# Patient Record
Sex: Male | Born: 1950 | Race: Black or African American | Hispanic: No | Marital: Married | Smoking: Current some day smoker
Health system: Southern US, Community
[De-identification: ages and names within clinical notes are randomized; demographics above are authoritative.]

## PROBLEM LIST (undated history)

## (undated) DIAGNOSIS — I8290 Acute embolism and thrombosis of unspecified vein: Secondary | ICD-10-CM

## (undated) DIAGNOSIS — K2970 Gastritis, unspecified, without bleeding: Secondary | ICD-10-CM

## (undated) DIAGNOSIS — B9681 Helicobacter pylori [H. pylori] as the cause of diseases classified elsewhere: Secondary | ICD-10-CM

## (undated) DIAGNOSIS — K852 Alcohol induced acute pancreatitis without necrosis or infection: Secondary | ICD-10-CM

## (undated) DIAGNOSIS — I10 Essential (primary) hypertension: Secondary | ICD-10-CM

## (undated) HISTORY — DX: Gastritis, unspecified, without bleeding: K29.70

## (undated) HISTORY — DX: Helicobacter pylori (H. pylori) as the cause of diseases classified elsewhere: B96.81

## (undated) HISTORY — PX: HIP SURGERY: SHX245

---

## 2001-09-20 ENCOUNTER — Emergency Department (HOSPITAL_COMMUNITY): Admission: EM | Admit: 2001-09-20 | Discharge: 2001-09-20 | Payer: Self-pay | Admitting: Internal Medicine

## 2002-06-10 ENCOUNTER — Emergency Department (HOSPITAL_COMMUNITY): Admission: EM | Admit: 2002-06-10 | Discharge: 2002-06-10 | Payer: Self-pay | Admitting: *Deleted

## 2002-06-10 ENCOUNTER — Encounter: Payer: Self-pay | Admitting: *Deleted

## 2003-09-13 ENCOUNTER — Emergency Department (HOSPITAL_COMMUNITY): Admission: EM | Admit: 2003-09-13 | Discharge: 2003-09-13 | Payer: Self-pay | Admitting: Emergency Medicine

## 2006-05-18 ENCOUNTER — Emergency Department (HOSPITAL_COMMUNITY): Admission: EM | Admit: 2006-05-18 | Discharge: 2006-05-18 | Payer: Self-pay | Admitting: Emergency Medicine

## 2006-05-28 ENCOUNTER — Inpatient Hospital Stay (HOSPITAL_COMMUNITY): Admission: EM | Admit: 2006-05-28 | Discharge: 2006-06-01 | Payer: Self-pay | Admitting: Emergency Medicine

## 2008-12-15 ENCOUNTER — Emergency Department (HOSPITAL_COMMUNITY): Admission: EM | Admit: 2008-12-15 | Discharge: 2008-12-15 | Payer: Self-pay | Admitting: Emergency Medicine

## 2008-12-16 ENCOUNTER — Emergency Department (HOSPITAL_COMMUNITY): Admission: EM | Admit: 2008-12-16 | Discharge: 2008-12-16 | Payer: Self-pay | Admitting: Emergency Medicine

## 2009-03-18 ENCOUNTER — Ambulatory Visit (HOSPITAL_COMMUNITY): Admission: RE | Admit: 2009-03-18 | Discharge: 2009-03-18 | Payer: Self-pay | Admitting: Neurosurgery

## 2009-04-15 ENCOUNTER — Encounter: Admission: RE | Admit: 2009-04-15 | Discharge: 2009-04-15 | Payer: Self-pay | Admitting: Neurosurgery

## 2009-04-25 DIAGNOSIS — I8290 Acute embolism and thrombosis of unspecified vein: Secondary | ICD-10-CM

## 2009-04-25 HISTORY — DX: Acute embolism and thrombosis of unspecified vein: I82.90

## 2009-06-26 ENCOUNTER — Emergency Department (HOSPITAL_COMMUNITY): Admission: EM | Admit: 2009-06-26 | Discharge: 2009-06-26 | Payer: Self-pay | Admitting: Emergency Medicine

## 2009-10-11 ENCOUNTER — Emergency Department (HOSPITAL_COMMUNITY): Admission: EM | Admit: 2009-10-11 | Discharge: 2009-10-11 | Payer: Self-pay | Admitting: Emergency Medicine

## 2010-07-11 LAB — POCT I-STAT, CHEM 8
BUN: 10 mg/dL (ref 6–23)
Calcium, Ion: 1.1 mmol/L — ABNORMAL LOW (ref 1.12–1.32)
Chloride: 109 mEq/L (ref 96–112)
Creatinine, Ser: 0.9 mg/dL (ref 0.4–1.5)
Glucose, Bld: 92 mg/dL (ref 70–99)
HCT: 45 % (ref 39.0–52.0)
TCO2: 24 mmol/L (ref 0–100)

## 2010-07-18 LAB — CBC
HCT: 39.6 % (ref 39.0–52.0)
Hemoglobin: 13.9 g/dL (ref 13.0–17.0)
WBC: 6.8 10*3/uL (ref 4.0–10.5)

## 2010-07-18 LAB — DIFFERENTIAL
Basophils Absolute: 0 10*3/uL (ref 0.0–0.1)
Basophils Relative: 1 % (ref 0–1)
Eosinophils Absolute: 0.1 10*3/uL (ref 0.0–0.7)
Eosinophils Relative: 1 % (ref 0–5)
Lymphocytes Relative: 40 % (ref 12–46)
Monocytes Absolute: 0.5 10*3/uL (ref 0.1–1.0)
Monocytes Relative: 8 % (ref 3–12)
Neutro Abs: 3.4 10*3/uL (ref 1.7–7.7)

## 2010-07-18 LAB — POCT CARDIAC MARKERS
CKMB, poc: <1 ng/mL — ABNORMAL LOW (ref 1.0–8.0)
Myoglobin, poc: 53.9 ng/mL (ref 12–200)
Troponin i, poc: <0.05 ng/mL (ref 0.00–0.09)

## 2010-07-18 LAB — BASIC METABOLIC PANEL
Calcium: 8.6 mg/dL (ref 8.4–10.5)
Creatinine, Ser: 0.87 mg/dL (ref 0.4–1.5)
Glucose, Bld: 90 mg/dL (ref 70–99)
Sodium: 139 mEq/L (ref 135–145)

## 2010-09-10 NOTE — H&P (Signed)
[name], [name] NO.:  [id number]   MEDICAL RECORD NO.:  [id number]          PATIENT TYPE:  INP   LOCATION:  A228                          FACILITY:  [facility]   PHYSICIAN:  Gardiner Barefoot, MD    DATE OF BIRTH:  09-Jan-1951   DATE OF ADMISSION:  05/28/2006  DATE OF DISCHARGE:  LH                              HISTORY & PHYSICAL   PRIMARY CARE PHYSICIAN:  Unassigned.   CHIEF COMPLAINT:  Abdominal pain.   HISTORY OF PRESENT ILLNESS:  This is a 60 year old African American with  a history of heavy alcohol use, who comes in with complaint of abdominal  pain for the last 2 days.  The patient does report that this morning he  woke up and it was very painful and of somewhat acute onset.  The  patient reports he has had some nausea and vomiting without any blood,  no fever and pain has been essentially in the lower abdomen on both  sides.  The patient reports he has had this pain before; however, did  not seek medical attention at the time.  Of note, the patient also  reports that he drinks daily and without alcohol, he does go into  withdrawal.  Otherwise, the patient reports no other medical problems.   PAST MEDICAL HISTORY:  None.   MEDICATIONS:  None.   ALLERGIES:  SULFA.   SOCIAL HISTORY:  The patient smokes and drinks daily.  No alcohol use.   FAMILY HISTORY:  Pancreatitis.   REVIEW OF SYSTEMS:  Complete 12-point review of systems was obtained and  was negative other than that as presented in the history of present  illness.   PHYSICAL EXAM:  VITAL SIGNS:  Temperature is 98.0 degrees, pulse 78,  respirations 18, blood pressure 177/91 and O2 SATs 99% on room air.  GENERAL:  The patient is awake, alert and oriented x3 and appears in  mild distress.  HEENT:  Anicteric.  No thrush, no lymphadenopathy.  CARDIOVASCULAR:  Regular rate and rhythm.  No murmurs, rubs, or gallops.  LUNGS:  Clear to auscultation bilaterally.  ABDOMEN:  Soft, diffusely tender and  mildly distended with positive  bowel sounds and no hepatosplenomegaly.  EXTREMITIES:  There is no clubbing, cyanosis, or edema.  SKIN:  No rashes.   LABORATORY DATA:  WBC 6.6, hemoglobin 15.3, platelets 106,000.  Sodium  136, potassium 4.0, chloride 102, bicarb was 22, glucose 106, BUN 6,  creatinine 0.81.  Lipase 338.  Alcohol level 21.  Urinalysis negative.   CAT scan of abdomen and pelvis notable for edema, inflammation in the  retroperitoneal tissues around the pancreatic head and duodenum, likely  associated with pancreatitis.  Doubt mass involvement; however, followup  imaging is recommended for resolution after the pancreatitis has  resolved.  Otherwise, pelvis without any acute findings.   ASSESSMENT AND PLAN:  1. Pancreatitis:  This is likely secondary to his heavy alcohol use      and the patient was counseled on quitting alcohol at this time;      otherwise, he will continue to have issues  with pancreatitis.  The      patient is in agreement and is willing to quit and actually has a      history of being alcohol-free for approximately 7 years, however,      recently started up again due to family stressors.  We will have      the patient be nothing-by-mouth and advance his diet as tolerated      and otherwise, with a normal white count, hemoglobin and other      laboratory values essentially normal, no indication for any further      management for mild to moderate exacerbation of pancreatitis.  2. Pain:  We will assure adequate pain control with Dilaudid and wean      the patient to oral therapy as tolerated.  3. Alcohol abuse:  The patient will be placed on Valium 10 mg t.i.d.      starting today and will wean to b.i.d. the following day and      continue to wean until the patient is off of benzodiazepines.  4. Disposition:  The patient will need referral for a primary      physician.  5. Code status:  The patient is a full code.      Gardiner Barefoot, MD   Electronically Signed     RWC/MEDQ  D:  05/28/2006  T:  05/29/2006  Job:  [id number]

## 2010-09-10 NOTE — Discharge Summary (Signed)
NAMEBUCKY, GRIGG NO.:  [id number]   MEDICAL RECORD NO.:  [id number]          PATIENT TYPE:  INP   LOCATION:  A228                          FACILITY:  [facility]   PHYSICIAN:  Skeet Latch, DO    DATE OF BIRTH:  [birth year]   DATE OF ADMISSION:  05/28/2006  DATE OF DISCHARGE:  02/07/2008LH                               DISCHARGE SUMMARY   DISCHARGE DIAGNOSES:  1. Acute pancreatitis.  2. Alcohol abuse.  3. Hypocalcemia.  4. Hypokalemia.  5. Abdominal pain.   BRIEF HOSPITAL COURSE:  Mr. Mcphee is a 60 year old African-American  male who presented with a long-standing history of heavy alcohol use.  The patient presented complaining of severe abdominal pain that lasted  for approximately 2 days. The patient states the pain was acute in onset  and he had some nausea and vomiting.  The pain was located in his epigastric region and radiated to his lower  abdomen. The patient did report he had this pain in the past but did not  seek medical treatment for it. The patient was admitted to the hospital  and kept n.p.o. The patient did have a CT of his abdomen and pelvis  which showed inflammation of the retroperitoneal tissues around the  pancreatic head and duodenum. Also showed primary duodenitis with  secondary involvement of the pancreas. It also showed some diverticular  change in the sigmoid colon. The patient's diet was slowly increased and  patient started tolerating liquids and solid foods. The patient's pain  was controlled with IV medication. Patient's blood pressure was also  elevated and he was started on oral blood pressure medication. The  patient was also kept on an alcohol withdrawal protocol which included  Ativan. The patient was also placed on thiamin, a multivitamin and folic  acid. The patient's amylase and lipase were elevated and have slowly  decreased. The patient is very anxious to be discharged. The patient's  amylase and lipase today were  again elevated but the patient is adamant  about going home.   CONDITION AT DISCHARGE:  Stable.   ACTIVITY:  Increase activity as tolerated.   DIET:  Heart healthy low sodium diet. The patient is to refrain from any  high fatty foods.   PERTINENT TO NURSING: Will try to find patient a local physician for  follow-up.   The patient is to abstain from any alcohol use of any kind. The patient  was offered alcohol rehabilitation and patient declines at this time. We  had a long discussion regarding risks of continued alcohol use,  regarding liver damage and recurrent pancreatitis. The patient is aware  of risks.   DISCHARGE MEDICATIONS:  1. Norvasc 10 mg daily.  2. Multivitamin daily.  3. Thiamin 100 mg daily.  4. Ativan 1 mg p.o. t.i.d. p.r.n.  5. Reglan 10 mg p.o. q.6-8 hours p.r.n. nausea.  6. Folic acid 1 mg p.o. daily.      Skeet Latch, DO  Electronically Signed     SM/MEDQ  D:  06/01/2006  T:  06/01/2006  Job:  [phone] [id number]

## 2011-02-18 ENCOUNTER — Emergency Department (HOSPITAL_COMMUNITY): Payer: Medicare Other

## 2011-02-18 ENCOUNTER — Encounter: Payer: Self-pay | Admitting: *Deleted

## 2011-02-18 ENCOUNTER — Emergency Department (HOSPITAL_COMMUNITY)
Admission: EM | Admit: 2011-02-18 | Discharge: 2011-02-18 | Disposition: A | Discharge status: Home / Self Care | Payer: Medicare Other | Attending: Internal Medicine | Admitting: Internal Medicine

## 2011-02-18 DIAGNOSIS — K861 Other chronic pancreatitis: Secondary | ICD-10-CM | POA: Insufficient documentation

## 2011-02-18 DIAGNOSIS — F17200 Nicotine dependence, unspecified, uncomplicated: Secondary | ICD-10-CM | POA: Insufficient documentation

## 2011-02-18 DIAGNOSIS — I10 Essential (primary) hypertension: Secondary | ICD-10-CM | POA: Insufficient documentation

## 2011-02-18 DIAGNOSIS — M79609 Pain in unspecified limb: Secondary | ICD-10-CM | POA: Insufficient documentation

## 2011-02-18 DIAGNOSIS — R209 Unspecified disturbances of skin sensation: Secondary | ICD-10-CM | POA: Insufficient documentation

## 2011-02-18 DIAGNOSIS — I82409 Acute embolism and thrombosis of unspecified deep veins of unspecified lower extremity: Secondary | ICD-10-CM | POA: Insufficient documentation

## 2011-02-18 HISTORY — DX: Essential (primary) hypertension: I10

## 2011-02-18 HISTORY — DX: Alcohol induced acute pancreatitis without necrosis or infection: K85.20

## 2011-02-18 LAB — POCT I-STAT, CHEM 8
Calcium, Ion: 1.2 mmol/L (ref 1.12–1.32)
Chloride: 107 mEq/L (ref 96–112)
Potassium: 4.1 mEq/L (ref 3.5–5.1)
Sodium: 143 mEq/L (ref 135–145)

## 2011-02-18 LAB — CBC
HCT: 44.5 % (ref 39.0–52.0)
Hemoglobin: 14.9 g/dL (ref 13.0–17.0)
MCV: 93.3 fL (ref 78.0–100.0)
RBC: 4.77 MIL/uL (ref 4.22–5.81)
RDW: 13.3 % (ref 11.5–15.5)
WBC: 7.1 10*3/uL (ref 4.0–10.5)

## 2011-02-18 MED ORDER — ENOXAPARIN SODIUM 80 MG/0.8ML ~~LOC~~ SOLN
1.0000 mg/kg | Freq: Two times a day (BID) | SUBCUTANEOUS | Status: DC
  Filled 2011-02-18: qty 0.8

## 2011-02-18 MED ORDER — WARFARIN SODIUM 10 MG PO TABS
10.0000 mg | ORAL_TABLET | ORAL | Status: AC
  Administered 2011-02-18: 10 mg via ORAL
  Filled 2011-02-18: qty 1

## 2011-02-18 MED ORDER — ENOXAPARIN SODIUM 100 MG/ML ~~LOC~~ SOLN: 1.0000 mg/kg | Freq: Two times a day (BID) | SUBCUTANEOUS | Status: DC

## 2011-02-18 MED ORDER — ENOXAPARIN (LOVENOX) PATIENT EDUCATION KIT
PACK | Freq: Once | Status: DC
  Filled 2011-02-18: qty 1

## 2011-02-18 MED ORDER — ENOXAPARIN SODIUM 80 MG/0.8ML ~~LOC~~ SOLN
1.0000 mg/kg | Freq: Once | SUBCUTANEOUS | Status: AC
  Administered 2011-02-18: 75 mg via SUBCUTANEOUS
  Filled 2011-02-18: qty 0.8

## 2011-02-18 MED ORDER — WARFARIN SODIUM 7.5 MG PO TABS: 7.5000 mg | ORAL_TABLET | Freq: Every day | ORAL | Status: DC

## 2011-02-18 NOTE — H&P (Signed)
PCP:   No primary provider on file.   Chief Complaint:  Right leg pain since last Monday.   HPI:  Patient is a 60 year old African American male, who has a prior history of alcoholism-claims he has not had a drink for the past 5-6 years, claims to have had no prior history of venous thromboembolism comes in with the above-noted complaints. The patient he started having right calf pain on ambulating 10-20 feet , he said a few days ago his right leg was slightly swollen as well, games that he has numbness in his right toe region. He came to the emergency room for further evaluation today and upon further investigation was found to have a right lower extremity deep vein thrombosis. Patient does not have a primary care practitioner, he has never had to use Lovenox in the past and he is now being admitted to the hospitalist service for further evaluation and treatment. He denies any chest pain or shortness of breath to me. There is no history of fever.  Review of Systems:  The patient denies anorexia, fever, weight loss,, vision loss, decreased hearing, hoarseness, chest pain, syncope, dyspnea on exertion, peripheral edema, balance deficits, hemoptysis, abdominal pain, melena, hematochezia, severe indigestion/heartburn, hematuria, incontinence, genital sores, muscle weakness, suspicious skin lesions, transient blindness, difficulty walking, depression, unusual weight change, abnormal bleeding, enlarged lymph nodes, angioedema, and breast masses.  Past Medical History: Past Medical History  Diagnosis Date  . Pancreatitis, alcoholic   . Hypertension    Past Surgical History  Procedure Date  . Hip surgery     right    Medications: Prior to Admission medications   Medication Sig Start Date End Date Taking? Authorizing Provider  aspirin 325 MG tablet Take 325 mg by mouth daily. For pain    Yes Historical Provider, MD  ibuprofen (ADVIL,MOTRIN) 200 MG tablet Take 200 mg by mouth every 6 (six)  hours as needed. pain    Yes Historical Provider, MD    Allergies:   Allergies  Allergen Reactions  . Sulfa Antibiotics Nausea And Vomiting    Social History:  reports that he has been smoking Cigarettes.  He does not have any smokeless tobacco history on file. He reports that he does not drink alcohol or use illicit drugs.  Family History: No family history on file-however claims that one sister and one brother do have had a history of having venous thromboembolism and are on chronic anticoagulation.  Physical Exam: Blood pressure 136/81, pulse 54, temperature 98.3 F (36.8 C), temperature source Oral, resp. rate 16, height 5\' 6"  (1.676 m), weight 72.576 kg (160 lb), SpO2 98.00%. General appearance -awake alert, speech clear, in no distress at all. HEENT -atraumatic normocephalic pupils are equally reactive to light and accommodation. Neck -supple Chest -bilaterally clear to auscultation CVS -S1-S2 regular, no murmurs heard. Abdomen -bowel sounds present, soft nontender and nondistended. Extremities -no edema, his right leg does not appear swollen or erythematous when compared to his left leg. Neurology -awake alert, nonfocal exam. Speech clear. Skin -no rash Wounds -N./A.  Labs on Admission:   Basename 02/18/11 1112  NA 143  K 4.1  CL 107  CO2 --  GLUCOSE 85  BUN 12  CREATININE 1.00  CALCIUM --  MG --  PHOS --   No results found for this basename: AST:2,ALT:2,ALKPHOS:2,BILITOT:2,PROT:2,ALBUMIN:2 in the last 72 hours No results found for this basename: LIPASE:2,AMYLASE:2 in the last 72 hours  Basename 02/18/11 1112  WBC --  NEUTROABS --  HGB  14.6  HCT 43.0  MCV --  PLT --   No results found for this basename: CKTOTAL:3,CKMB:3,CKMBINDEX:3,TROPONINI:3 in the last 72 hours No results found for this basename: TSH,T4TOTAL,FREET3,T3FREE,THYROIDAB in the last 72 hours No results found for this basename: VITAMINB12:2,FOLATE:2,FERRITIN:2,TIBC:2,IRON:2,RETICCTPCT:2 in  the last 72 hours  Radiological Exams on Admission: Dg Lumbar Spine Complete  02/18/2011  *RADIOLOGY REPORT*  Clinical Data: Right leg and foot tingling.  LUMBAR SPINE - COMPLETE 4+ VIEW  Comparison: CT  05/28/2006  Findings: AP, lateral and oblique images of the lumbar spine were obtained.  There is a component of dextroscoliosis in the lumbar spine.  The vertebral body heights are maintained.  Degenerative changes in the facets. Calcifications involving the abdominal aorta.  IMPRESSION: No acute bony abnormality to the lumbar spine.  Original Report Authenticated By: Richarda Overlie, M.D.   Dg Hip Complete Right  02/18/2011  *RADIOLOGY REPORT*  Clinical Data: Right leg and foot tingling.  RIGHT HIP - COMPLETE 2+ VIEW  Comparison: None.  Findings: Single view of the pelvis and two views of the right hip were obtained.  Deformity of the right proximal femur is suggestive for previous fracture and internal fixation.  Evidence for hardware removal.  There are prominent vascular calcifications in the pelvis and the right hip is located.  No evidence for acute fracture. There may be degenerative change along the inferior aspect of the hip joints bilaterally.  IMPRESSION: No acute bony abnormality to the pelvis or right hip.  Post surgical and/or traumatic changes to the right hip.  Original Report Authenticated By: Richarda Overlie, M.D.   Ct Head Wo Contrast  02/18/2011  *RADIOLOGY REPORT*  Clinical Data: Right-sided leg pain.  Weakness for 5 days.  CT HEAD WITHOUT CONTRAST  Technique:  Contiguous axial images were obtained from the base of the skull through the vertex without contrast.  Comparison: 06/26/2009.  Findings: No mass lesion, mass effect, midline shift, hydrocephalus, hemorrhage.  No territorial ischemia or acute infarction.  Mucoperiosteal thickening is present within the left maxillary sinus.  Sphenoid sinuses clear.  Frontal sinuses clear. Mastoid air cells clear.  IMPRESSION: Negative CT head.   Chronic-appearing left maxillary sinus disease.  Original Report Authenticated By: Andreas Newport, M.D.   US Venous Img Lower Unilateral Right  02/18/2011  *RADIOLOGY REPORT*  Clinical Data: Right leg pain and swelling.   LOWER EXTREMITY VENOUS DUPLEX ULTRASOUND  Technique:  Gray-scale sonography with graded compression, as well as color Doppler and duplex ultrasound were performed to evaluate the deep venous system of the lower extremity from the level of the common femoral vein through the popliteal and proximal calf veins. Spectral Doppler was utilized to evaluate flow at rest and with distal augmentation maneuvers.  Comparison:  None.  Findings:  There is partial compressibility of the right common femoral vein.  There is color Doppler flow within the common femoral vein.  There is flow at the saphenofemoral junction.  The proximal femoral vein is compressible.  There is echogenic material within the right femoral vein suggesting thrombus.  The mid and distal femoral vein are partially compressible with some flow. Partial compressibility of the popliteal vein with eccentric wall thickening or eccentric thrombus. There is color Doppler flow within the calf veins.  IMPRESSION: Study is positive for deep vein thrombosis involving the right femoral vein, right popliteal vein and possibly the right common femoral vein.  However, there is partial compressibility of these vessels with some flow.  Findings most likely represent subacute or  chronic DVT.  Please note that an acute component cannot be excluded.  Original Report Authenticated By: Richarda Overlie, M.D.   Dg Knee Complete 4 Views Right  02/18/2011  *RADIOLOGY REPORT*  Clinical Data: Right leg and foot tingling.  RIGHT KNEE - COMPLETE 4+ VIEW  Comparison: None.  Findings: Four views of the right knee were obtained.  No evidence for acute fracture or dislocation.  No significant degenerative disease.  No evidence to suggest a suprapatellar joint effusion.  There are a few vascular calcifications.  IMPRESSION: No acute bony abnormality to the right knee.  Original Report Authenticated By: Richarda Overlie, M.D.   Dg Foot Complete Right  02/18/2011  *RADIOLOGY REPORT*  Clinical Data: 60 year old male with right foot pain.  RIGHT FOOT COMPLETE - 3+ VIEW  Comparison: None  Findings: There is no evidence of acute fracture, subluxation, or dislocation. The Lisfranc joints are intact. No focal bony lesions are identified. There is no evidence of radiopaque foreign body.  The joint spaces are unremarkable.  IMPRESSION: No acute or significant bony abnormalities.  Original Report Authenticated By: Rosendo Gros, M.D.    Assessment/Plan Present on Admission:  .DVT of leg (deep venous thrombosis) -Given his family history of venous thromboembolism, and given the fact that this patient has had no obvious provoking factors it is very possible that he in fact does have a underlying hypercoagulable state. I will see if he has already received his Lovenox , if not I will try and send out a hypercoagulable panel. If he has also received his Lovenox then he needs to be medically treated for this for at least 3-6 months and then at that point perhaps a hypercoagulable workup will need to be sent out. -Unfortunately this patient does not have a primary care practitioner, I have talked to the patient and his family members and they all are currently in the process of trying to see if they can hook him up with Dr. Lissa Morales, if in fact they are successful in getting an appointment early next week, patient will be provided Lovenox teaching and will be discharged home later today. -I will followup later today.  History of alcoholism-patient claims that he has not had alcohol since his last episode of pancreatitis which he claims was a few years ago.  Tobacco abuse-patient has been counseled extensively.  Total time spent 45 minutes.   Jeoffrey Massed 02/18/2011, 2:23 PM

## 2011-02-18 NOTE — ED Provider Notes (Signed)
Scribed for Laray Anger, DO, the patient was seen in room APA01/APA01 . This chart was scribed by Ellie Lunch.    CSN: [id number] Arrival date & time: 02/18/2011 10:37 AM    Chief Complaint  Patient presents with  . Leg Pain     [name] seen at 10:52 Patient is a 60 y.o. male presenting with leg pain. The history is provided by the patient. No language interpreter was used.  Leg Pain    [name] is a 60 y.o. male who presents to the Emergency Department complaining of gradual onset and persistence of constant "numbness" in his right dorsal foot and "soreness" in his right calf x 4 days. [name] says he was morning lawn 4 days ago when his right lower leg became "swollen." Swelling has improved, but [name] reports the top of his right foot continues to be "numb and tingling."  [name] reports previous right hip surgery where he had pins placed and later removed from his hip "a while ago."  Denies any injury, no rash, no focal motor weakness, no fevers, no abd pain, no back pain.    Past Medical History  Diagnosis Date  . Pancreatitis, alcoholic   . Hypertension     Past Surgical History  Procedure Date  . Hip surgery     right     History  Substance Use Topics  . Smoking status: Current Everyday Smoker    Types: Cigarettes  . Smokeless tobacco: Not on file  . Alcohol Use: No      Review of Systems ROS: Statement: All systems negative except as marked or noted in the HPI; Constitutional: Negative for fever and chills. ; ; Eyes: Negative for eye pain, redness and discharge. ; ; ENMT: Negative for ear pain, hoarseness, nasal congestion, sinus pressure and sore throat. ; ; Cardiovascular: Negative for chest pain, palpitations, diaphoresis, dyspnea and peripheral edema. ; ; Respiratory: Negative for cough, wheezing and stridor. ; ; Gastrointestinal: Negative for nausea, vomiting, diarrhea and abdominal pain, blood in stool, hematemesis, jaundice and rectal bleeding. . ; ;  Genitourinary: Negative for dysuria, flank pain and hematuria. ; ; Musculoskeletal: +right calf pain and swelling.  Negative for back pain and neck pain. Negative for trauma.; ; Skin: Negative for pruritus, rash, abrasions, blisters, bruising and skin lesion.; ; Neuro: Negative for headache, lightheadedness and neck stiffness. Negative for weakness, altered level of consciousness , altered mental status, extremity weakness, involuntary movement, seizure and syncope. +right dorsal foot and anterior ankle "numbness."    Allergies  Sulfa antibiotics  Home Medications   Current Outpatient Rx  Name Route Sig Dispense Refill  . ASPIRIN 325 MG PO TABS Oral Take 325 mg by mouth daily. For pain     . IBUPROFEN 200 MG PO TABS Oral Take 200 mg by mouth every 6 (six) hours as needed. pain       BP 145/94  Pulse 62  Temp(Src) 98.3 F (36.8 C) (Oral)  Resp 18  Ht 5\' 6"  (1.676 m)  Wt 160 lb (72.576 kg)  BMI 25.82 kg/m2  SpO2 97%  Physical Exam 1055: Physical examination:  Nursing notes reviewed; Vital signs and O2 SAT reviewed;  Constitutional: Well developed, Well nourished, Well hydrated, In no acute distress; Head:  Normocephalic, atraumatic; Eyes: EOMI, PERRL, No scleral icterus; ENMT: Mouth and pharynx normal, Mucous membranes moist; Neck: Supple, Full range of motion, No lymphadenopathy; Cardiovascular: Regular rate and rhythm, No murmur, rub, or gallop; Respiratory: Breath sounds clear &  equal bilaterally, No rales, rhonchi, wheezes, or rub, Normal respiratory effort/excursion; Chest: Nontender, Movement normal; Abdomen: Soft, Nontender, Nondistended, Normal bowel sounds; Spine:  No midline CS, TS, LS tenderness. Genitourinary: No CVA tenderness; Extremities: Pedal pulses palpable and equal bilat, No tenderness, No edema, No calf edema or asymmetry, brisk cap refill in toes. Muscle compartments soft RLE.  Right foot warm/dry, no rash..; Neuro: AA&Ox3, Major CN grossly intact.  No gross focal motor  deficits in extremities.  +right dorsal foot with subjective decreased sensation, no decreased sensation plantar right foot.; Skin: Color normal, Warm, Dry, no rash, no petechiae, no open wounds.    ED Course  Procedures  MDM  MDM Reviewed: vitals and nursing note Interpretation: x-ray, labs, CT scan and ultrasound   Results for orders placed during the hospital encounter of 02/18/11  POCT I-STAT, CHEM 8      Component Value Range   Sodium 143  135 - 145 (mEq/L)   Potassium 4.1  3.5 - 5.1 (mEq/L)   Chloride 107  96 - 112 (mEq/L)   BUN 12  6 - 23 (mg/dL)   Creatinine, Ser 2.13  0.50 - 1.35 (mg/dL)   Glucose, Bld 85  70 - 99 (mg/dL)   Calcium, Ion 0.86  1.12 - 1.32 (mmol/L)   TCO2 24  0 - 100 (mmol/L)   Hemoglobin 14.6  13.0 - 17.0 (g/dL)   HCT 57.8  46.9 - 62.9 (%)   Dg Lumbar Spine Complete  02/18/2011  *RADIOLOGY REPORT*  Clinical Data: Right leg and foot tingling.  LUMBAR SPINE - COMPLETE 4+ VIEW  Comparison: CT  05/28/2006  Findings: AP, lateral and oblique images of the lumbar spine were obtained.  There is a component of dextroscoliosis in the lumbar spine.  The vertebral body heights are maintained.  Degenerative changes in the facets. Calcifications involving the abdominal aorta.  IMPRESSION: No acute bony abnormality to the lumbar spine.  Original Report Authenticated By: Richarda Overlie, M.D.   Dg Hip Complete Right  02/18/2011  *RADIOLOGY REPORT*  Clinical Data: Right leg and foot tingling.  RIGHT HIP - COMPLETE 2+ VIEW  Comparison: None.  Findings: Single view of the pelvis and two views of the right hip were obtained.  Deformity of the right proximal femur is suggestive for previous fracture and internal fixation.  Evidence for hardware removal.  There are prominent vascular calcifications in the pelvis and the right hip is located.  No evidence for acute fracture. There may be degenerative change along the inferior aspect of the hip joints bilaterally.  IMPRESSION: No acute  bony abnormality to the pelvis or right hip.  Post surgical and/or traumatic changes to the right hip.  Original Report Authenticated By: Richarda Overlie, M.D.   Ct Head Wo Contrast  02/18/2011  *RADIOLOGY REPORT*  Clinical Data: Right-sided leg pain.  Weakness for 5 days.  CT HEAD WITHOUT CONTRAST  Technique:  Contiguous axial images were obtained from the base of the skull through the vertex without contrast.  Comparison: 06/26/2009.  Findings: No mass lesion, mass effect, midline shift, hydrocephalus, hemorrhage.  No territorial ischemia or acute infarction.  Mucoperiosteal thickening is present within the left maxillary sinus.  Sphenoid sinuses clear.  Frontal sinuses clear. Mastoid air cells clear.  IMPRESSION: Negative CT head.  Chronic-appearing left maxillary sinus disease.  Original Report Authenticated By: Andreas Newport, M.D.   US Venous Img Lower Unilateral Right  02/18/2011  *RADIOLOGY REPORT*  Clinical Data: Right leg pain and swelling.  LOWER EXTREMITY VENOUS DUPLEX ULTRASOUND  Technique:  Gray-scale sonography with graded compression, as well as color Doppler and duplex ultrasound were performed to evaluate the deep venous system of the lower extremity from the level of the common femoral vein through the popliteal and proximal calf veins. Spectral Doppler was utilized to evaluate flow at rest and with distal augmentation maneuvers.  Comparison:  None.  Findings:  There is partial compressibility of the right common femoral vein.  There is color Doppler flow within the common femoral vein.  There is flow at the saphenofemoral junction.  The proximal femoral vein is compressible.  There is echogenic material within the right femoral vein suggesting thrombus.  The mid and distal femoral vein are partially compressible with some flow. Partial compressibility of the popliteal vein with eccentric wall thickening or eccentric thrombus. There is color Doppler flow within the calf veins.  IMPRESSION:  Study is positive for deep vein thrombosis involving the right femoral vein, right popliteal vein and possibly the right common femoral vein.  However, there is partial compressibility of these vessels with some flow.  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  Please note that an acute component cannot be excluded.  Original Report Authenticated By: Richarda Overlie, M.D.   Dg Knee Complete 4 Views Right  02/18/2011  *RADIOLOGY REPORT*  Clinical Data: Right leg and foot tingling.  RIGHT KNEE - COMPLETE 4+ VIEW  Comparison: None.  Findings: Four views of the right knee were obtained.  No evidence for acute fracture or dislocation.  No significant degenerative disease.  No evidence to suggest a suprapatellar joint effusion. There are a few vascular calcifications.  IMPRESSION: No acute bony abnormality to the right knee.  Original Report Authenticated By: Richarda Overlie, M.D.   Dg Foot Complete Right  02/18/2011  *RADIOLOGY REPORT*  Clinical Data: 60 year old male with right foot pain.  RIGHT FOOT COMPLETE - 3+ VIEW  Comparison: None  Findings: There is no evidence of acute fracture, subluxation, or dislocation. The Lisfranc joints are intact. No focal bony lesions are identified. There is no evidence of radiopaque foreign body.  The joint spaces are unremarkable.  IMPRESSION: No acute or significant bony abnormalities.  Original Report Authenticated By: Rosendo Gros, M.D.   1:25 PM:  [name] with acute RLE DVT proximal to knee.  Has no PMD.  Dx testing d/w [name].  Questions answered.  Verb understanding, agreeable to admit.  T/C to [facility]. Jerral Ralph, case discussed, including:  HPI, pertinent PM/SHx, VS/PE, dx testing, ED course and treatment.  Agreeable to admit.  Requests to write temporary orders, med bed to team AP2.  Req to start lovenox SQ and coumadin PO per pharmacy to dose.     [facility] M  I personally performed the services described in this documentation, which was scribed in my  presence. The recorded information has been reviewed and considered.        Laray Anger, DO 02/18/11 [phone]

## 2011-02-18 NOTE — ED Notes (Signed)
C/o pain/swelling/numbness/tingling to right leg x 5 days.  States right calf has been swollen.

## 2011-02-18 NOTE — ED Notes (Signed)
Pt wanting to be d/c home on Lovonox injections and f/u with PCP on Monday.  Admitting MD notified and gave v/o.  Ordered to call Admitting MD when blood work comes back.  Pt verbalized understanding.  Pharmacy called for Lovonox d/c teaching kit.  Social worker also paged to discuss insurance and Rx fills.

## 2011-02-18 NOTE — Discharge Summary (Signed)
Physician Discharge Summary  Patient ID: GROVE DEFINA MRN: [id number] DOB/AGE: 1951-01-26 60 y.o. Primary Care Physician:No primary provider on file. Admit date: 02/18/2011 Discharge date: 02/18/2011    Discharge Diagnoses:  Principal Problem:  *DVT of leg (deep venous thrombosis)   Current Discharge Medication List    START taking these medications   Details  enoxaparin (LOVENOX) 100 MG/ML SOLN Inject 0.75 mLs (75 mg total) into the skin every 12 (twelve) hours. Qty: 22.4 mL, Refills: 0    warfarin (COUMADIN) 7.5 MG tablet Take 1 tablet (7.5 mg total) by mouth daily. Qty: 10 tablet, Refills: 0      CONTINUE these medications which have NOT CHANGED   Details  ibuprofen (ADVIL,MOTRIN) 200 MG tablet Take 200 mg by mouth every 6 (six) hours as needed. pain       STOP taking these medications     aspirin 325 MG tablet         Discharged Condition: Stable  Consults: None  Significant Diagnostic Studies: Dg Lumbar Spine Complete  02/18/2011  *RADIOLOGY REPORT*  Clinical Data: Right leg and foot tingling.  LUMBAR SPINE - COMPLETE 4+ VIEW  Comparison: CT  05/28/2006  Findings: AP, lateral and oblique images of the lumbar spine were obtained.  There is a component of dextroscoliosis in the lumbar spine.  The vertebral body heights are maintained.  Degenerative changes in the facets. Calcifications involving the abdominal aorta.  IMPRESSION: No acute bony abnormality to the lumbar spine.  Original Report Authenticated By: Richarda Overlie, M.D.   Dg Hip Complete Right  02/18/2011  *RADIOLOGY REPORT*  Clinical Data: Right leg and foot tingling.  RIGHT HIP - COMPLETE 2+ VIEW  Comparison: None.  Findings: Single view of the pelvis and two views of the right hip were obtained.  Deformity of the right proximal femur is suggestive for previous fracture and internal fixation.  Evidence for hardware removal.  There are prominent vascular calcifications in the pelvis and the right hip is  located.  No evidence for acute fracture. There may be degenerative change along the inferior aspect of the hip joints bilaterally.  IMPRESSION: No acute bony abnormality to the pelvis or right hip.  Post surgical and/or traumatic changes to the right hip.  Original Report Authenticated By: Richarda Overlie, M.D.   Ct Head Wo Contrast  02/18/2011  *RADIOLOGY REPORT*  Clinical Data: Right-sided leg pain.  Weakness for 5 days.  CT HEAD WITHOUT CONTRAST  Technique:  Contiguous axial images were obtained from the base of the skull through the vertex without contrast.  Comparison: 06/26/2009.  Findings: No mass lesion, mass effect, midline shift, hydrocephalus, hemorrhage.  No territorial ischemia or acute infarction.  Mucoperiosteal thickening is present within the left maxillary sinus.  Sphenoid sinuses clear.  Frontal sinuses clear. Mastoid air cells clear.  IMPRESSION: Negative CT head.  Chronic-appearing left maxillary sinus disease.  Original Report Authenticated By: Andreas Newport, M.D.   US Venous Img Lower Unilateral Right  02/18/2011  *RADIOLOGY REPORT*  Clinical Data: Right leg pain and swelling.   LOWER EXTREMITY VENOUS DUPLEX ULTRASOUND  Technique:  Gray-scale sonography with graded compression, as well as color Doppler and duplex ultrasound were performed to evaluate the deep venous system of the lower extremity from the level of the common femoral vein through the popliteal and proximal calf veins. Spectral Doppler was utilized to evaluate flow at rest and with distal augmentation maneuvers.  Comparison:  None.  Findings:  There is partial compressibility of the  right common femoral vein.  There is color Doppler flow within the common femoral vein.  There is flow at the saphenofemoral junction.  The proximal femoral vein is compressible.  There is echogenic material within the right femoral vein suggesting thrombus.  The mid and distal femoral vein are partially compressible with some flow. Partial  compressibility of the popliteal vein with eccentric wall thickening or eccentric thrombus. There is color Doppler flow within the calf veins.  IMPRESSION: Study is positive for deep vein thrombosis involving the right femoral vein, right popliteal vein and possibly the right common femoral vein.  However, there is partial compressibility of these vessels with some flow.  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  Please note that an acute component cannot be excluded.  Original Report Authenticated By: Richarda Overlie, M.D.   Dg Knee Complete 4 Views Right  02/18/2011  *RADIOLOGY REPORT*  Clinical Data: Right leg and foot tingling.  RIGHT KNEE - COMPLETE 4+ VIEW  Comparison: None.  Findings: Four views of the right knee were obtained.  No evidence for acute fracture or dislocation.  No significant degenerative disease.  No evidence to suggest a suprapatellar joint effusion. There are a few vascular calcifications.  IMPRESSION: No acute bony abnormality to the right knee.  Original Report Authenticated By: Richarda Overlie, M.D.   Dg Foot Complete Right  02/18/2011  *RADIOLOGY REPORT*  Clinical Data: 60 year old male with right foot pain.  RIGHT FOOT COMPLETE - 3+ VIEW  Comparison: None  Findings: There is no evidence of acute fracture, subluxation, or dislocation. The Lisfranc joints are intact. No focal bony lesions are identified. There is no evidence of radiopaque foreign body.  The joint spaces are unremarkable.  IMPRESSION: No acute or significant bony abnormalities.  Original Report Authenticated By: Rosendo Gros, M.D.    Lab Results: Results for orders placed during the hospital encounter of 02/18/11 (from the past 48 hour(s))  POCT I-STAT, CHEM 8     Status: Normal   Collection Time   02/18/11 11:12 AM      Component Value Range Comment   Sodium 143  135 - 145 (mEq/L)    Potassium 4.1  3.5 - 5.1 (mEq/L)    Chloride 107  96 - 112 (mEq/L)    BUN 12  6 - 23 (mg/dL)    Creatinine, Ser  9.14  0.50 - 1.35 (mg/dL)    Glucose, Bld 85  70 - 99 (mg/dL)    Calcium, Ion 7.82  1.12 - 1.32 (mmol/L)    TCO2 24  0 - 100 (mmol/L)    Hemoglobin 14.6  13.0 - 17.0 (g/dL)    HCT 95.6  21.3 - 08.6 (%)   PROTIME-INR     Status: Normal   Collection Time   02/18/11  2:45 PM      Component Value Range Comment   Prothrombin Time 13.4  11.6 - 15.2 (seconds)    INR 1.00  0.00 - 1.49    CBC     Status: Abnormal   Collection Time   02/18/11  2:45 PM      Component Value Range Comment   WBC 7.1  4.0 - 10.5 (K/uL)    RBC 4.77  4.22 - 5.81 (MIL/uL)    Hemoglobin 14.9  13.0 - 17.0 (g/dL)    HCT 57.8  46.9 - 62.9 (%)    MCV 93.3  78.0 - 100.0 (fL)    MCH 31.2  26.0 - 34.0 (pg)  MCHC 33.5  30.0 - 36.0 (g/dL)    RDW 16.1  09.6 - 04.5 (%)    Platelets 115 (*) 150 - 400 (K/uL)    No results found for this or any previous visit (from the past 240 hour(s)).   Hospital Course:  1. Right lower extremity DVT -Patient presented to the ED with right leg pain and numbness, upon further evaluation it was discovered that the patient did have a DVT in his right lower extremity. The hospitalist service was consulted to admit the patient, as the patient did not have a primary care doctor. Upon evaluation by this M.D. and further discussion with the patient and family, they were very keen to be discharged today. The family then arranged a followup appointment for the patient on October 29 at 2 PM with Dr. Damien Fusi. Patient was given Lovenox teaching and a dose of Lovenox as well as Coumadin here in the ED. I have had a detailed conversation with the patient and the patient's closes relative who is like a son and is currently at bedside, the patient's relative is very familiar with Lovenox and claims that he can inject it for the patientl. Prescription for Lovenox and Coumadin have been written, patient has been instructed to go to [place], where they have stocks of Lovenox for the patient to pick  up. Patient will continue on the 75 mg of Lovenox twice daily and 7.5 mg of Coumadin daily until seen by his PCP next Monday. We will then defer further dosing off his Coumadin and Lovenox to his PCP. -Patient was instructed about the side effects of anticoagulation including life threatening bleeding he claimed understanding and was told to seek immediate medical attention if he were to have any  signs of this. -As noted in my history and physical, the patient's brother patient's brother and a sister have a history of having deep vein thrombosis, a Doppler ultrasound done in the ED was suggestive of subacute to chronic DVT, this patient does not have any obvious provoking factors and as a result there is high suspicion for underlying Hypercoagulable state. Unfortunately patient was already given Lovenox before a hypercoagulable panel was ordered and as a result he would now need further workup  to be done after he completes 3-6 months course of anticoagulation. I do not think this patient is up-to-date in terms of  cancer screening, we will defer this to his new primary care practitioner as well, to be done in the outpatient setting. -Patient was instructed to go to the pharmacy straightaway from the emergency room to pick up his prescriptions and then only to go home. He was also told that if in case he had any difficulties filling his prescriptions that he was to return to the emergency room. -Please note that the patient does not have any chest pain or shortness of breath.  Discharge Exam: Blood pressure 124/72, pulse 48, temperature 97.6 F (36.4 C), temperature source Oral, resp. rate 20, height 5\' 6"  (1.676 m), weight 72.576 kg (160 lb), SpO2 100.00%.  Gen. exam-awake alert and clear speech not in any distress. Neck-supple, no JVD neck Chest-bilaterally clear to auscultation. CVS-heart sounds are regular no murmurs heard. Abdomen-soft nontender nondistended. Extremities-no edema. Please note  that the patient's right lower extremity does not show any swelling erythema when compared to his left lower extremity. Neurology-awake alert with no focal deficits.   Disposition: Home.  -Total time spent-30 minutes.    Follow-up Information  Follow up with NNADI,VICTORIA, MD on 02/21/2011. (patient has a scheduled appt at 2pm on 02/21/11)    Contact information:   [address] [place] [address] [phone]          Signed: Jeoffrey Massed 02/18/2011, 5:25 PM

## 2011-05-21 ENCOUNTER — Encounter (HOSPITAL_COMMUNITY): Payer: Self-pay

## 2011-05-21 ENCOUNTER — Emergency Department (HOSPITAL_COMMUNITY)
Admission: EM | Admit: 2011-05-21 | Discharge: 2011-05-21 | Disposition: A | Discharge status: Home / Self Care | Payer: Medicare Other | Attending: Emergency Medicine | Admitting: Emergency Medicine

## 2011-05-21 ENCOUNTER — Emergency Department (HOSPITAL_COMMUNITY): Payer: Medicare Other

## 2011-05-21 DIAGNOSIS — M25519 Pain in unspecified shoulder: Secondary | ICD-10-CM | POA: Insufficient documentation

## 2011-05-21 DIAGNOSIS — Y92009 Unspecified place in unspecified non-institutional (private) residence as the place of occurrence of the external cause: Secondary | ICD-10-CM | POA: Insufficient documentation

## 2011-05-21 DIAGNOSIS — S0990XA Unspecified injury of head, initial encounter: Secondary | ICD-10-CM | POA: Insufficient documentation

## 2011-05-21 DIAGNOSIS — S40019A Contusion of unspecified shoulder, initial encounter: Secondary | ICD-10-CM | POA: Insufficient documentation

## 2011-05-21 DIAGNOSIS — I10 Essential (primary) hypertension: Secondary | ICD-10-CM | POA: Insufficient documentation

## 2011-05-21 DIAGNOSIS — F17200 Nicotine dependence, unspecified, uncomplicated: Secondary | ICD-10-CM | POA: Insufficient documentation

## 2011-05-21 DIAGNOSIS — Z7901 Long term (current) use of anticoagulants: Secondary | ICD-10-CM | POA: Insufficient documentation

## 2011-05-21 DIAGNOSIS — W108XXA Fall (on) (from) other stairs and steps, initial encounter: Secondary | ICD-10-CM | POA: Insufficient documentation

## 2011-05-21 LAB — PROTIME-INR
INR: 2.26 — ABNORMAL HIGH (ref 0.00–1.49)
Prothrombin Time: 25.3 seconds — ABNORMAL HIGH (ref 11.6–15.2)

## 2011-05-21 MED ORDER — OXYCODONE-ACETAMINOPHEN 5-325 MG PO TABS
1.0000 | ORAL_TABLET | Freq: Once | ORAL | Status: AC
  Administered 2011-05-21: 1 via ORAL
  Filled 2011-05-21: qty 1

## 2011-05-21 NOTE — ED Provider Notes (Signed)
History    61 year old male as I had her fall. Patient was coming down the steps when he missed one fell down the last 2. Did strike his head. Patient is also complaining of pain in his left shoulder. Denies pain anywhere else. No numbness, tingling loss of strength. Patient is on Coumadin. Denies any visual changes. No n/v. No confusion. Has been ambulating without difficulty.  CSN: [id number]  Arrival date & time 05/21/11  1131   First MD Initiated Contact with Patient 05/21/11 1203      Chief Complaint  Patient presents with  . Fall  . Head Injury  . Shoulder Pain    (Consider location/radiation/quality/duration/timing/severity/associated sxs/prior treatment) HPI  Past Medical History  Diagnosis Date  . Pancreatitis, alcoholic   . Hypertension     Past Surgical History  Procedure Date  . Hip surgery     right    No family history on file.  History  Substance Use Topics  . Smoking status: Current Everyday Smoker    Types: Cigarettes  . Smokeless tobacco: Not on file  . Alcohol Use: No      Review of Systems   Review of symptoms negative unless otherwise noted in HPI.   Allergies  Sulfa antibiotics  Home Medications   Current Outpatient Rx  Name Route Sig Dispense Refill  . TRAMADOL HCL 50 MG PO TABS Oral Take 50 mg by mouth every 6 (six) hours as needed. Pain    . WARFARIN SODIUM 4 MG PO TABS Oral Take 8 mg by mouth daily.      BP 149/85  Pulse 68  Temp(Src) 98 F (36.7 C) (Oral)  Resp 20  Ht 5\' 6"  (1.676 m)  Wt 172 lb (78.019 kg)  BMI 27.76 kg/m2  SpO2 100%  Physical Exam  Nursing note and vitals reviewed. Constitutional: He is oriented to person, place, and time. He appears well-developed and well-nourished. No distress.       Sitting up in bed. No acute distress.  HENT:  Head: Normocephalic and atraumatic.  Right Ear: External ear normal.  Left Ear: External ear normal.       No significant bony tenderness of the skull. No hematoma  appreciated. There is no hemotympanum. No Battle sign.  Eyes: Conjunctivae are normal. Pupils are equal, round, and reactive to light. Right eye exhibits no discharge. Left eye exhibits no discharge.  Neck: Neck supple.  Cardiovascular: Normal rate, regular rhythm and normal heart sounds.  Exam reveals no gallop and no friction rub.   No murmur heard. Pulmonary/Chest: Effort normal and breath sounds normal. No respiratory distress.  Abdominal: Soft. He exhibits no distension. There is no tenderness.  Musculoskeletal: Normal range of motion. He exhibits tenderness. He exhibits no edema.       No midline spinal tenderness. Left shoulder is grossly normal in appearance and symmetric as compared to the right side. Patient has mild tenderness posteriorly. Full range of motion both actively and passively with mild increase of pain. neurovascularly intact distally. There were no concerning overlying skin changes  Neurological: He is alert and oriented to person, place, and time. No cranial nerve deficit. He exhibits normal muscle tone. Coordination normal.       Good finger to nose testing bilaterally. Normal appearing gait.  Skin: Skin is warm and dry.  Psychiatric: He has a normal mood and affect. His behavior is normal. Thought content normal.    ED Course  Procedures (including critical care time)  Labs Reviewed  PROTIME-INR - Abnormal; Notable for the following:    Prothrombin Time 25.3 (*)    INR 2.26 (*)    All other components within normal limits   Ct Head Wo Contrast  05/21/2011  *RADIOLOGY REPORT*  Clinical Data: Fall, head injury  CT HEAD WITHOUT CONTRAST  Technique:  Contiguous axial images were obtained from the base of the skull through the vertex without contrast.  Comparison: Head CT 02/18/2011  Findings: No intracranial hemorrhage.  No parenchymal contusion. No midline shift or mass effect.  Basilar cisterns are patent. No skull base fracture.  No fluid in the paranasal sinuses or  mastoid air cells.  There is a white matter hypodensity within the right internal capsule unchanged from prior.  IMPRESSION:  1.  No intracranial trauma. 2. Small focus of deep white matter disease consistent with small vessel ischemia is unchanged from prior.  Original Report Authenticated By: Genevive Bi, M.D.   Dg Shoulder Left  05/21/2011  *RADIOLOGY REPORT*  Clinical Data: Status post fall  LEFT SHOULDER - 2+ VIEW  Comparison: None  Findings: There is moderate degenerative change  involving the left AC joint.  No fracture or subluxation is identified.  No radio-opaque foreign bodies or soft tissue calcification identified.  IMPRESSION:  1.  No acute findings. 2.  AC joint osteoarthritis.  Original Report Authenticated By: Rosealee Albee, M.D.     1. Closed head injury   2. Shoulder contusion       MDM  61 year old male with closed head injury and shoulder pain after fall. This fall was mechanical no history to suggest syncope. Patient is on Coumadin. INRs within therapeutic range. CT scans did not show any evidence of acute traumatic injury. Plan for the shoulder are grossly normal. Patient has a nonfocal neurological examination. Repeat examination prior to discharge is unchanged. Head injury instructions discussed. Outpatient followup as needed. As needed pain medication.        Raeford Razor, MD 05/29/11 2044

## 2011-05-21 NOTE — ED Notes (Signed)
Pt presents with shoulder pain and head injury after falling down 2 stairs this AM. Pt takes Coumadin.

## 2014-07-17 ENCOUNTER — Ambulatory Visit: Payer: Medicare Other

## 2014-07-17 ENCOUNTER — Encounter: Payer: Self-pay | Admitting: Podiatry

## 2014-07-17 ENCOUNTER — Ambulatory Visit (INDEPENDENT_AMBULATORY_CARE_PROVIDER_SITE_OTHER): Payer: Medicare Other | Admitting: Podiatry

## 2014-07-17 VITALS — BP 138/89 | HR 66 | Resp 16

## 2014-07-17 DIAGNOSIS — B351 Tinea unguium: Secondary | ICD-10-CM

## 2014-07-17 DIAGNOSIS — M722 Plantar fascial fibromatosis: Secondary | ICD-10-CM | POA: Diagnosis not present

## 2014-07-17 DIAGNOSIS — Q828 Other specified congenital malformations of skin: Secondary | ICD-10-CM | POA: Diagnosis not present

## 2014-07-17 DIAGNOSIS — M79676 Pain in unspecified toe(s): Secondary | ICD-10-CM | POA: Diagnosis not present

## 2014-07-17 NOTE — Progress Notes (Signed)
   Subjective:    Patient ID: [name], male    DOB: 05/11/1950, 64 y.o.   MRN: [id number]  HPI Comments: "I have places on my feet that hurt"  Patient c/o tender, callused areas plantar heel, sub 1st and 5th MPJ bilateral for several years. He usually trims down with razor blade. Hurts to walk when real thick.   Patient is currently being treated for a blood clot right leg.     Review of Systems  Cardiovascular: Positive for leg swelling.  Musculoskeletal: Positive for gait problem.  All other systems reviewed and are negative.      Objective:   Physical Exam: I have reviewed his past medical history medications allergies surgery social history and review of systems. Pulses are palpable bilateral. Diminished PT bilateral. Neurologic sensorium is intact bilateral deep tendon reflexes are intact and muscle strength is intact. Orthopedic evaluation demonstrates also a cystlike Laforge motion or crepitus pes planus is noted bilateral with hammertoe deformities and mild HAV deformities. Cutaneous evaluation shows supple well-hydrated use dorsum of the foot dry xerotic skin plantar aspect of the foot with multiple purported keratomas greater than 20 in number. No erythema cellulitis drainage or odor. His nails are thick yellow dystrophic clinic mycotic and painful on palpation as well as debridement.        Assessment & Plan:  Assessment: Pain in limb secondary to onychomycosis and poor keratomas bilateral.  Plan: Reviewed all reactive hyperkeratosis and debrided nails 1 through 5 bilateral.

## 2014-09-18 ENCOUNTER — Encounter: Payer: Self-pay | Admitting: Podiatry

## 2014-09-18 ENCOUNTER — Ambulatory Visit (INDEPENDENT_AMBULATORY_CARE_PROVIDER_SITE_OTHER): Payer: Medicare Other | Admitting: Podiatry

## 2014-09-18 VITALS — BP 130/76 | HR 64 | Resp 16

## 2014-09-18 DIAGNOSIS — B351 Tinea unguium: Secondary | ICD-10-CM

## 2014-09-18 DIAGNOSIS — Q828 Other specified congenital malformations of skin: Secondary | ICD-10-CM

## 2014-09-18 DIAGNOSIS — M722 Plantar fascial fibromatosis: Secondary | ICD-10-CM

## 2014-09-18 DIAGNOSIS — M79676 Pain in unspecified toe(s): Secondary | ICD-10-CM

## 2014-09-18 NOTE — Progress Notes (Signed)
He presents today with chief complaint of painful elongated toenails and multiple porokeratosis plantar aspect of the bilateral foot.  Objective: Vital signs are stable he's alert oriented 3 pulses are palpable bilateral. Superficial wound sub-first metatarsophalangeal joint where he appeared to try to degree his callus himself. I'll otherwise multiple porokeratosis plantar aspect of the bilateral foot with thick yellow dystrophic onychomycotic nails bilateral.  Assessment: Keratoses and pain in limb secondary to onychomycosis bilateral.  Plan: Debrided all reactive hyperkeratosis and nails 1 through 5 bilateral covered service secondary to pain.

## 2014-12-25 ENCOUNTER — Ambulatory Visit: Payer: Medicare Other | Admitting: Podiatry

## 2014-12-31 ENCOUNTER — Ambulatory Visit: Payer: Medicare Other | Admitting: Podiatry

## 2015-01-07 ENCOUNTER — Ambulatory Visit: Payer: Medicare Other | Admitting: Podiatry

## 2015-01-08 ENCOUNTER — Encounter: Payer: Self-pay | Admitting: Podiatry

## 2015-01-08 ENCOUNTER — Ambulatory Visit (INDEPENDENT_AMBULATORY_CARE_PROVIDER_SITE_OTHER): Payer: Medicare Other | Admitting: Podiatry

## 2015-01-08 DIAGNOSIS — Q828 Other specified congenital malformations of skin: Secondary | ICD-10-CM | POA: Diagnosis not present

## 2015-01-08 DIAGNOSIS — B351 Tinea unguium: Secondary | ICD-10-CM | POA: Diagnosis not present

## 2015-01-08 DIAGNOSIS — M79676 Pain in unspecified toe(s): Secondary | ICD-10-CM | POA: Diagnosis not present

## 2015-01-08 NOTE — Progress Notes (Signed)
Patient ID: [name], male   DOB: 01/11/1951, 64 y.o.   MRN: [id number] HPI  Complaint:  Visit Type: Patient returns to my office for continued preventative foot care services. Complaint: Patient states" my nails have grown long and thick and become painful to walk and wear shoes" Patient has been diagnosed with DVT right leg. . This patient  presents for preventative foot care services. No changes to ROS  Podiatric Exam: Vascular: dorsalis pedis and posterior tibial pulses are non-palpable. Capillary return is diminished. Cold feet noted.. Skin turgor WNL, bilateral swelling  Sensorium: Normal Semmes Weinstein monofilament test. Normal tactile sensation bilaterally.  Nail Exam: Pt has thick disfigured discolored nails with subungual debris noted bilateral entire nail hallux through fifth toenails Ulcer Exam: There is no evidence of ulcer or pre-ulcerative changes or infection. Orthopedic Exam: Muscle tone and strength are WNL. No limitations in general ROM. No crepitus or effusions noted. Foot type and digits show no abnormalities. Bony prominences are unremarkable. Skin: No Porokeratosis. No infection or ulcers. Multiple porokeratosis both feet including right heel and sub5th right  Diagnosis:  Onychomycosis, Pain in right toe, pain in left toes, Porokeratosis B/L  Treatment & Plan Procedures and Treatment: Consent by patient was obtained for treatment procedures. The patient understood the discussion of treatment and procedures well. All questions were answered thoroughly reviewed. Debridement of mycotic and hypertrophic toenails, 1 through 5 bilateral and clearing of subungual debris. No ulceration, no infection noted. Debride porokeratosis Return Visit-Office Procedure: Patient instructed to return to the office for a follow up visit 3 months for continued evaluation and treatment.

## 2015-03-23 ENCOUNTER — Other Ambulatory Visit: Payer: Self-pay | Admitting: *Deleted

## 2015-03-23 DIAGNOSIS — I739 Peripheral vascular disease, unspecified: Secondary | ICD-10-CM

## 2015-03-23 DIAGNOSIS — L98491 Non-pressure chronic ulcer of skin of other sites limited to breakdown of skin: Secondary | ICD-10-CM

## 2015-04-10 ENCOUNTER — Encounter: Payer: Self-pay | Admitting: Vascular Surgery

## 2015-04-16 ENCOUNTER — Encounter: Payer: Self-pay | Admitting: Vascular Surgery

## 2015-04-16 ENCOUNTER — Ambulatory Visit (HOSPITAL_COMMUNITY)
Admission: RE | Admit: 2015-04-16 | Discharge: 2015-04-16 | Disposition: A | Discharge status: Home / Self Care | Payer: Medicare Other | Source: Ambulatory Visit | Attending: Vascular Surgery | Admitting: Vascular Surgery

## 2015-04-16 ENCOUNTER — Other Ambulatory Visit: Payer: Self-pay | Admitting: Vascular Surgery

## 2015-04-16 ENCOUNTER — Ambulatory Visit (INDEPENDENT_AMBULATORY_CARE_PROVIDER_SITE_OTHER)
Admission: RE | Admit: 2015-04-16 | Discharge: 2015-04-16 | Disposition: A | Discharge status: Home / Self Care | Payer: Medicare Other | Source: Ambulatory Visit | Attending: Vascular Surgery | Admitting: Vascular Surgery

## 2015-04-16 ENCOUNTER — Ambulatory Visit (INDEPENDENT_AMBULATORY_CARE_PROVIDER_SITE_OTHER): Payer: Medicare Other | Admitting: Vascular Surgery

## 2015-04-16 VITALS — BP 130/81 | HR 66 | Temp 98.1°F | Resp 16 | Ht 66.0 in | Wt 156.0 lb

## 2015-04-16 DIAGNOSIS — L98491 Non-pressure chronic ulcer of skin of other sites limited to breakdown of skin: Secondary | ICD-10-CM

## 2015-04-16 DIAGNOSIS — L97909 Non-pressure chronic ulcer of unspecified part of unspecified lower leg with unspecified severity: Secondary | ICD-10-CM

## 2015-04-16 DIAGNOSIS — I739 Peripheral vascular disease, unspecified: Secondary | ICD-10-CM

## 2015-04-16 DIAGNOSIS — R938 Abnormal findings on diagnostic imaging of other specified body structures: Secondary | ICD-10-CM | POA: Diagnosis not present

## 2015-04-16 DIAGNOSIS — I10 Essential (primary) hypertension: Secondary | ICD-10-CM | POA: Diagnosis not present

## 2015-04-16 NOTE — Progress Notes (Signed)
[facility] [place] HISTORY AND PHYSICAL   History of Present Illness:  Patient is a 64 y.o. male who presents for evaluation of  A nonhealing wound right foot. The patient developed a blister over the right lateral malleolus 3-4 months ago. This has slowly enlarged despite [facility]. He also has short distance claudication in this leg at 1 block. He denies rest pain in the foot. He does have shortness of breath with exertion. He denies chest pain. He denies any prior operations on his leg except for a right hip fracture several years ago. 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 He is on Coumadin for this. This is frequently checked by Dr. Julie [place] in [place].   He currently smokes one half packs of cigarettes per day. He states he is ready to quit. Greater than 3 minutes today were spent regarding smoking cessation.Other medical problems include  Hypertension and prior history of alcoholic pancreatitis. These are both currently stable.  Past Medical History  Diagnosis Date  . Pancreatitis, alcoholic   . Hypertension     Past Surgical History  Procedure Laterality Date  . Hip surgery      right    Social History Social History  Substance Use Topics  . Smoking status: Current Every Day Smoker    Types: Cigarettes  . Smokeless tobacco: Never Used  . Alcohol Use: No    Family History History reviewed. No pertinent family history.  Allergies  Allergies  Allergen Reactions  . Sulfa Antibiotics Itching and Rash     Current Outpatient Prescriptions  Medication Sig Dispense Refill  . amLODipine (NORVASC) 10 MG tablet Take 10 mg by mouth daily.    . diclofenac sodium (VOLTAREN) 1 % GEL Apply topically 4 (four) times daily.    Marland Kitchen. gabapentin (NEURONTIN) 100 MG capsule Take 100 mg by mouth 3 (three) times daily.    . traMADol (ULTRAM) 50 MG tablet Take 50 mg by mouth every 6 (six) hours as needed. Pain    .  warfarin (COUMADIN) 4 MG tablet Take 8 mg by mouth daily.     No current facility-administered medications for this visit.    ROS:   General:  No weight loss, Fever, chills  HEENT: No recent headaches, no nasal bleeding, no visual changes, no sore throat  Neurologic: No dizziness, blackouts, seizures. No recent symptoms of stroke or mini- stroke. No recent episodes of slurred speech, or temporary blindness.  Cardiac: No recent episodes of chest pain/pressure, no shortness of breath at rest.  +shortness of breath with exertion.  Denies history of atrial fibrillation or irregular heartbeat  Vascular: No history of rest pain in feet.  +history of claudication. + history of non-healing ulcer,+ history of DVT   Pulmonary: No home oxygen, no productive cough, no hemoptysis,  No asthma or wheezing  Musculoskeletal:  [ ]  Arthritis, [ ]  Low back pain,  [ ]  Joint pain  Hematologic:No history of hypercoagulable state.  No history of easy bleeding.  No history of anemia  Gastrointestinal: No hematochezia or melena,  No gastroesophageal reflux, no trouble swallowing  Urinary: [ ]  chronic Kidney disease, [ ]  on HD - [ ]  MWF or [ ]  TTHS, [ ]  Burning with urination, [ ]  Frequent urination, [ ]  Difficulty urinating;   Skin: No rashes  Psychological: No history of anxiety,  No history of depression   Physical Examination  Filed Vitals:  04/16/15 1505  BP: 130/81  Pulse: 66  Temp: 98.1 F (36.7 C)  TempSrc: Oral  Resp: 16  Height:  (1.676 m)  Weight: 156 lb (70.761 kg)  SpO2: 96%    Body mass index is 25.19 kg/(m^2).  General:  Alert and oriented, no acute distress HEENT: Normal Neck: No bruit or JVD Pulmonary: Clear to auscultation bilaterally Cardiac: Regular Rate and Rhythm without murmur Abdomen: Soft, non-tender, non-distended, no mass, no scars Skin: No rash,  3 cm ulcer right lateral malleolus some areas of granulation tissue at the base no surrounding  erythema Extremity Pulses:  2+ radial, brachial, absent femoral, dorsalis pedis, posterior tibial pulses bilaterally Musculoskeletal: No deformity or edema  Neurologic: Upper and lower extremity motor 5/5 and symmetric  DATA:   Patient had bilateral ABIs performed today which were 0 on the right 0.41 on the left common femoral arteries were occluded bilaterally superficial femoral arteries occluded bilaterally popliteal and tibial vessels occluded in the right leg monophasic flow in the popliteal and tibial vessels left leg   ASSESSMENT:   Severe peripheral arterial disease 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 We will also schedule him for cardiac risk stratification appointments as he will most likely need aortoiliac reconstruction with possible outflow procedure as well. The patient will return to see me in follow-up after the CT scan and cardiology evaluation for a definitive plan.  At the time of his arterial reconstruction we will need to bridge his Coumadin most likely. Smoking cessation was emphasized and high risk of limb loss was also emphasized with the patient today  Fabienne Bruns, MD [facility] [place] Office: [phone] Pager: [phone]   Fabienne Bruns, MD [facility] [place] Office: [phone] Pager: [phone]

## 2015-04-17 NOTE — Addendum Note (Signed)
Addended by: Adria DillELDRIDGE-LEWIS, Adriella Essex L on: 04/17/2015 10:11 AM   Modules accepted: Orders

## 2015-04-21 ENCOUNTER — Ambulatory Visit: Payer: Medicare Other | Admitting: Podiatry

## 2015-04-22 ENCOUNTER — Other Ambulatory Visit: Payer: Self-pay | Admitting: *Deleted

## 2015-04-22 ENCOUNTER — Other Ambulatory Visit: Payer: Self-pay | Admitting: Vascular Surgery

## 2015-04-22 DIAGNOSIS — Z01812 Encounter for preprocedural laboratory examination: Secondary | ICD-10-CM

## 2015-04-22 LAB — BUN: BUN: 13 mg/dL (ref 7–25)

## 2015-04-22 LAB — CREATININE, SERUM: Creat: 0.75 mg/dL (ref 0.70–1.25)

## 2015-04-23 ENCOUNTER — Encounter: Payer: Self-pay | Admitting: Vascular Surgery

## 2015-04-24 ENCOUNTER — Encounter: Payer: Self-pay | Admitting: Podiatry

## 2015-04-24 ENCOUNTER — Ambulatory Visit (INDEPENDENT_AMBULATORY_CARE_PROVIDER_SITE_OTHER): Payer: Medicare Other | Admitting: Podiatry

## 2015-04-24 DIAGNOSIS — Q828 Other specified congenital malformations of skin: Secondary | ICD-10-CM

## 2015-04-24 DIAGNOSIS — M79676 Pain in unspecified toe(s): Secondary | ICD-10-CM | POA: Diagnosis not present

## 2015-04-24 DIAGNOSIS — B351 Tinea unguium: Secondary | ICD-10-CM | POA: Diagnosis not present

## 2015-04-24 NOTE — Progress Notes (Signed)
Patient ID: Martin DunkerRex A Grant, male   DOB: 03/11/1951, 64 y.o.   MRN: [id number] HPI  Complaint:  Visit Type: Patient returns to my office for continued preventative foot care services. Complaint: Patient states" my nails have grown long and thick and become painful to walk and wear shoes" Patient has been diagnosed with DVT right leg. Following the DVT he developed ulcer on lateral malleolus right foot. He is awaiting vascular surgery according to the patient. . This patient  presents for preventative foot care services. No changes to ROS  Podiatric Exam: Vascular: dorsalis pedis and posterior tibial pulses are non-palpable. Capillary return is diminished. Cold feet noted.. Skin turgor WNL, bilateral swelling  Sensorium: Normal Semmes Weinstein monofilament test. Normal tactile sensation bilaterally.  Nail Exam: Pt has thick disfigured discolored nails with subungual debris noted bilateral entire nail hallux through fifth toenails Ulcer Exam: There is no evidence of ulcer or pre-ulcerative changes or infection. Orthopedic Exam: Muscle tone and strength are WNL. No limitations in general ROM. No crepitus or effusions noted. Foot type and digits show no abnormalities. Bony prominences are unremarkable. Skin: No Porokeratosis. No infection or ulcers. Multiple porokeratosis both feet including right heel and sub5th right.  Ulcer right ankle.  Diagnosis:  Onychomycosis, Pain in right toe, pain in left toes, Porokeratosis B/L  Treatment & Plan Procedures and Treatment: Consent by patient was obtained for treatment procedures. The patient understood the discussion of treatment and procedures well. All questions were answered thoroughly reviewed. Debridement of mycotic and hypertrophic toenails, 1 through 5 bilateral and clearing of subungual debris. No ulceration, no infection noted. Debride porokeratosis Return Visit-Office Procedure: Patient instructed to return to the office for a follow up visit 3  months for continued evaluation and treatment.   Helane GuntherGregory Peggie Hornak DPM

## 2015-04-28 ENCOUNTER — Ambulatory Visit: Payer: Medicare Other | Admitting: Cardiology

## 2015-04-29 ENCOUNTER — Ambulatory Visit
Admission: RE | Admit: 2015-04-29 | Discharge: 2015-04-29 | Disposition: A | Discharge status: Home / Self Care | Payer: Medicare Other | Source: Ambulatory Visit | Attending: Vascular Surgery | Admitting: Vascular Surgery

## 2015-04-29 ENCOUNTER — Encounter: Payer: Self-pay | Admitting: Cardiology

## 2015-04-29 ENCOUNTER — Ambulatory Visit (INDEPENDENT_AMBULATORY_CARE_PROVIDER_SITE_OTHER): Payer: Medicare Other | Admitting: Cardiology

## 2015-04-29 ENCOUNTER — Encounter: Payer: Self-pay | Admitting: *Deleted

## 2015-04-29 VITALS — BP 135/81 | HR 64 | Ht 66.0 in | Wt 157.0 lb

## 2015-04-29 DIAGNOSIS — R0602 Shortness of breath: Secondary | ICD-10-CM

## 2015-04-29 DIAGNOSIS — Z01818 Encounter for other preprocedural examination: Secondary | ICD-10-CM | POA: Diagnosis not present

## 2015-04-29 DIAGNOSIS — Z136 Encounter for screening for cardiovascular disorders: Secondary | ICD-10-CM | POA: Diagnosis not present

## 2015-04-29 DIAGNOSIS — I739 Peripheral vascular disease, unspecified: Secondary | ICD-10-CM

## 2015-04-29 MED ORDER — IOPAMIDOL (ISOVUE-370) INJECTION 76%
100.0000 mL | Freq: Once | INTRAVENOUS | Status: AC | PRN
Start: 2015-04-29 — End: 2015-04-29
  Administered 2015-04-29: 100 mL via INTRAVENOUS

## 2015-04-29 NOTE — Progress Notes (Addendum)
Patient ID: [name], male   DOB: 04/24/1951, 65 y.o.   MRN: [id number]     Clinical Summary [name] is a 65 y.o.male seen today as a new patient for the following medical problems.  1. Preoperative cardiac evaluation - patient with history of nonhealing right foot sore as well as claudication, he is followed by vascular. He is being considered for surgery.  - denies any heart history - denies any chest pain. Exertion is mainly limited by leg pain but can have some DOE.   - has some swelling in left leg related to sore, but otherwise no LE edema. No orthopnea  CAD risk factors: +tobacco, brother MI 51  2. 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 Pancreatitis, alcoholic   . Hypertension      Allergies  Allergen Reactions  . Sulfa Antibiotics Itching and Rash     Current Outpatient Prescriptions  Medication Sig Dispense Refill  . amLODipine (NORVASC) 10 MG tablet Take 10 mg by mouth daily.    . diclofenac sodium (VOLTAREN) 1 % GEL Apply topically 4 (four) times daily.    Marland Kitchen gabapentin (NEURONTIN) 100 MG capsule Take 100 mg by mouth 3 (three) times daily.    . traMADol (ULTRAM) 50 MG tablet Take 50 mg by mouth every 6 (six) hours as needed. Pain    . warfarin (COUMADIN) 4 MG tablet Take 8 mg by mouth daily.     No current facility-administered medications for this visit.     Past Surgical History  Procedure Laterality Date  . Hip surgery      right     Allergies  Allergen Reactions  . Sulfa Antibiotics Itching and Rash      Family history He reports a brother with history of MI at age 64   Social History [name] reports that he has been smoking Cigarettes.  He has never used smokeless tobacco. [name] reports that he does not drink alcohol.   Review of Systems CONSTITUTIONAL: No weight loss, fever, chills, weakness or fatigue.  HEENT: Eyes: No visual loss, blurred vision, double vision or yellow sclerae.No  hearing loss, sneezing, congestion, runny nose or sore throat.  SKIN: No rash or itching.  CARDIOVASCULAR: per HPI RESPIRATORY: per hpi  GASTROINTESTINAL: No anorexia, nausea, vomiting or diarrhea. No abdominal pain or blood.  GENITOURINARY: No burning on urination, no polyuria NEUROLOGICAL: No headache, dizziness, syncope, paralysis, ataxia, numbness or tingling in the extremities. No change in bowel or bladder control.  MUSCULOSKELETAL: No muscle, back pain, joint pain or stiffness.  LYMPHATICS: No enlarged nodes. No history of splenectomy.  PSYCHIATRIC: No history of depression or anxiety.  ENDOCRINOLOGIC: No reports of sweating, cold or heat intolerance. No polyuria or polydipsia.  Marland Kitchen   Physical Examination Filed Vitals:   04/29/15 0822  BP: 135/81  Pulse: 64   Filed Vitals:   04/29/15 0822  Height: 5\' 6"  (1.676 m)  Weight: 157 lb (71.215 kg)    Gen: resting comfortably, no acute distress HEENT: no scleral icterus, pupils equal round and reactive, no palptable cervical adenopathy,  CV: RRR, no m/r/g, no jvd Resp: Clear to auscultation bilaterally GI: abdomen is soft, non-tender, non-distended, normal bowel sounds, no hepatosplenomegaly MSK: extremities are warm, no edema.  Skin: warm, no rash Neuro:  no focal deficits Psych: appropriate affect   Diagnostic Studies Apr 29 2015 clinic EKG (performed and reviewed in clinic): NSR    Assessment and  Plan  1. Preoperative evaluation - patient being considered for high risk vascular surgery - cannot assess exercise capacity by history, he is limited chronic chronic leg pain - multiple CAD risk factors. We will obtain Lexiscan MPI to better risk stratify him prior to vascular surgery   F/u pending stress results      Antoine PocheJonathan F. Niaja Stickley, M.D.    05/06/15 Addendum Eugenie BirksLexiscan without significant ischemia. From cardiac standpoint ok to proceed with surgery as planned. Would recommend coumadin bridge with lovenox given  his indication for anticoagulation being prior PE, his coumadin clinic should be able to arrange (he is not followed by us) but issues can contact us for assistance.   Dominga FerryJ Nylen Creque MD

## 2015-04-29 NOTE — Patient Instructions (Signed)
Your physician recommends that you schedule a follow-up appointment TO BE DETERMINED AFTER TESTING  Your physician recommends that you continue on your current medications as directed. Please refer to the Current Medication list given to you today.  Your physician has requested that you have a lexiscan myoview. For further information please visit [email or web address]. Please follow instruction sheet, as given.  Thank you for choosing [facility]!!   

## 2015-04-30 ENCOUNTER — Ambulatory Visit (INDEPENDENT_AMBULATORY_CARE_PROVIDER_SITE_OTHER): Payer: Medicare Other | Admitting: Vascular Surgery

## 2015-04-30 ENCOUNTER — Telehealth: Payer: Self-pay | Admitting: Cardiology

## 2015-04-30 ENCOUNTER — Encounter: Payer: Self-pay | Admitting: Vascular Surgery

## 2015-04-30 ENCOUNTER — Other Ambulatory Visit: Payer: Self-pay

## 2015-04-30 VITALS — BP 142/87 | HR 66 | Temp 97.5°F | Resp 18 | Ht 66.0 in | Wt 158.0 lb

## 2015-04-30 DIAGNOSIS — K635 Polyp of colon: Secondary | ICD-10-CM | POA: Insufficient documentation

## 2015-04-30 DIAGNOSIS — I739 Peripheral vascular disease, unspecified: Secondary | ICD-10-CM

## 2015-04-30 NOTE — Telephone Encounter (Signed)
Will forward to Dr. Branch. 

## 2015-04-30 NOTE — Telephone Encounter (Signed)
We do not follow his coumadin. Given he is on it for history of PEs he would need to bridged. His coumadin clinic would need to arrange this for him.   J Hollie Wojahn MD

## 2015-04-30 NOTE — Telephone Encounter (Signed)
I spoke with [name] at [facility] and informed her we do not follow his coumadin

## 2015-04-30 NOTE — Progress Notes (Signed)
[facility] [place] HISTORY AND PHYSICAL    History of Present Illness:  Patient is a 65 y.o. male who presents for evaluation of  A nonhealing wound right foot.  He was seen approximate 2 weeks ago. At the time of that evaluation his physical exam suggested aortoiliac occlusive disease.  He returns today for follow-up after recent cardiology evaluation as well as a CT angiogram of the abdomen and pelvis with runoff.  The patient developed a blister over the right lateral malleolus 3-4 months ago. This has slowly enlarged despite [facility].  It is unchanged over the last 2 weeks.He also has short distance claudication in this leg at 1 block. He denies rest pain in the foot. He does have shortness of breath with exertion. He denies chest pain. He denies any prior operations on his leg except for a right hip fracture several years ago. 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 He is on Coumadin for this. This is frequently checked by Dr. Julie SwazilandJordan in [place].   He currently smokes one half pack of cigarettes per day. He states he is ready to quit. Greater than 3 minutes today were spent regarding smoking cessation.  Other medical problems include  Hypertension and prior history of alcoholic pancreatitis. These are both currently stable.    Past Medical History   Diagnosis  Date   .  Pancreatitis, alcoholic     .  Hypertension         Past Surgical History   Procedure  Laterality  Date   .  Hip surgery           right     Social History Social History   Substance Use Topics   .  Smoking status:  Current Every Day Smoker       Types:  Cigarettes   .  Smokeless tobacco:  Never Used   .  Alcohol Use:  No     Family History History reviewed. No pertinent family history.  Allergies    Allergies   Allergen  Reactions   .  Sulfa Antibiotics  Itching and Rash        Current Outpatient Prescriptions   Medication   Sig  Dispense  Refill   .  amLODipine (NORVASC) 10 MG tablet  Take 10 mg by mouth daily.       .  diclofenac sodium (VOLTAREN) 1 % GEL  Apply topically 4 (four) times daily.       Marland Kitchen.  gabapentin (NEURONTIN) 100 MG capsule  Take 100 mg by mouth 3 (three) times daily.       .  traMADol (ULTRAM) 50 MG tablet  Take 50 mg by mouth every 6 (six) hours as needed. Pain       .  warfarin (COUMADIN) 4 MG tablet  Take 8 mg by mouth daily.          No current facility-administered medications for this visit.     ROS:    General:  No weight loss, Fever, chills  HEENT: No recent headaches, no nasal bleeding, no visual changes, no sore throat  Neurologic: No dizziness, blackouts, seizures. No recent symptoms of stroke or mini- stroke. No recent episodes of slurred speech, or temporary blindness.  Cardiac: No recent episodes of chest pain/pressure, no shortness of breath at rest.  +shortness of breath with exertion.  Denies history of atrial fibrillation or irregular heartbeat  Vascular: No history of rest pain in feet.  +history of claudication. + history of non-healing ulcer,+ history of DVT    Pulmonary: No home oxygen, no productive cough, no hemoptysis,  No asthma or wheezing  Musculoskeletal:   Arthritis,  Low back pain,   Joint pain  Hematologic:No history of hypercoagulable state.  No history of easy bleeding.  No history of anemia  Gastrointestinal: No hematochezia or melena,  No gastroesophageal reflux, no trouble swallowing  Urinary:  chronic Kidney disease,  on HD -  MWF or  TTHS,  Burning with urination,  Frequent urination,  Difficulty urinating;    Skin: No rashes  Psychological: No history of anxiety,  No history of depression   Physical Examination    Filed Vitals:   04/30/15 0905 04/30/15 0909  BP: 126/92 142/87  Pulse: 100 66  Temp: 97.5 F (36.4 C)   TempSrc: Oral   Resp: 18   Height:  (1.676 m)   Weight: 158 lb (71.668 kg)    SpO2: 100%     Body mass index is 25.19 kg/(m^2).  General:  Alert and oriented, no acute distress HEENT: Normal Neck: No bruit or JVD Pulmonary: Clear to auscultation bilaterally Cardiac: Regular Rate and Rhythm without murmur Abdomen: Soft, non-tender, non-distended, no mass, no scars Skin: No rash,  3 cm ulcer right lateral malleolus some areas of granulation tissue at the base no surrounding erythema Extremity Pulses:  2+ radial, brachial, absent femoral, dorsalis pedis, posterior tibial pulses bilaterally Musculoskeletal: No deformity or edema     Neurologic: Upper and lower extremity motor 5/5 and symmetric  DATA:   Patient had bilateral ABIs  Several weeks ago which were 0 on the right 0.41 on the left common femoral arteries were occluded bilaterally superficial femoral arteries occluded bilaterally popliteal and tibial vessels occluded in the right leg monophasic flow in the popliteal and tibial vessels left leg   CT angiogram the abdomen and pelvis was reviewed today. Incidental finding of a 7 mm polyp in the transverse colon. Arterial portion the exam shows bilateral external iliac artery occlusions over several centimeters. Bilateral common femoral occlusions with occlusion of the origin of the profunda bilaterally. Bilateral superficial femoral artery occlusions. Reconstitution of the below-knee popliteal artery with three-vessel runoff bilaterally.   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   He is scheduled for a cardiac stress test next week. At the time of his arterial reconstruction we will need to bridge his Coumadin most likely. Smoking cessation was emphasized and high risk of limb loss was also emphasized with the patient today.   I spoke with Dr.  Leone Payor from GI today who thought it would be reasonable to address the patient's aortoiliac occlusive disease first then the patient will need follow-up colonoscopy after he has recovered from his aortobifemoral bypass.  Fabienne Bruns, MD [facility] [place] Office: [phone] Pager: [phone]

## 2015-04-30 NOTE — Progress Notes (Signed)
Filed Vitals:   04/30/15 0905 04/30/15 0909  BP: 126/92 142/87  Pulse: 100 66  Temp: 97.5 F (36.4 C)   TempSrc: Oral   Resp: 18   Height: 5\' 6"  (1.676 m)   Weight: 158 lb (71.668 kg)   SpO2: 100%

## 2015-04-30 NOTE — Telephone Encounter (Signed)
Pt is having surgery the 1/18 and he's needing to stop his coumadin, they are telling him to stop his coumadin on 1/13

## 2015-05-06 ENCOUNTER — Telehealth: Payer: Self-pay

## 2015-05-06 ENCOUNTER — Encounter (HOSPITAL_COMMUNITY)
Admission: RE | Admit: 2015-05-06 | Discharge: 2015-05-06 | Disposition: A | Discharge status: Home / Self Care | Payer: Medicare Other | Source: Ambulatory Visit | Attending: Cardiology | Admitting: Cardiology

## 2015-05-06 ENCOUNTER — Inpatient Hospital Stay (HOSPITAL_COMMUNITY): Admission: RE | Admit: 2015-05-06 | Payer: Medicare Other | Source: Ambulatory Visit

## 2015-05-06 ENCOUNTER — Encounter (HOSPITAL_COMMUNITY): Payer: Self-pay

## 2015-05-06 DIAGNOSIS — Z01818 Encounter for other preprocedural examination: Secondary | ICD-10-CM | POA: Insufficient documentation

## 2015-05-06 DIAGNOSIS — R0602 Shortness of breath: Secondary | ICD-10-CM

## 2015-05-06 LAB — NM MYOCAR MULTI W/SPECT W/WALL MOTION / EF
CHL CUP NUCLEAR SRS: 0
CHL CUP NUCLEAR SSS: 0
LV dias vol: 78 mL
LV sys vol: 28 mL
NUC STRESS TID: 1.13
Peak HR: 80 {beats}/min
RATE: 0.32
Rest HR: 60 {beats}/min
SDS: 0

## 2015-05-06 MED ORDER — REGADENOSON 0.4 MG/5ML IV SOLN
INTRAVENOUS | Status: AC
  Administered 2015-05-06: 0.4 mg via INTRAVENOUS
  Filled 2015-05-06: qty 5

## 2015-05-06 MED ORDER — SODIUM CHLORIDE 0.9 % IJ SOLN
INTRAMUSCULAR | Status: AC
  Administered 2015-05-06: 10 mL via INTRAVENOUS
  Filled 2015-05-06: qty 3

## 2015-05-06 MED ORDER — TECHNETIUM TC 99M SESTAMIBI - CARDIOLITE
30.0000 | Freq: Once | INTRAVENOUS | Status: AC | PRN
  Administered 2015-05-06: 32 via INTRAVENOUS

## 2015-05-06 MED ORDER — TECHNETIUM TC 99M SESTAMIBI GENERIC - CARDIOLITE
10.0000 | Freq: Once | INTRAVENOUS | Status: AC | PRN
  Administered 2015-05-06: 10.6 via INTRAVENOUS

## 2015-05-06 NOTE — Telephone Encounter (Signed)
rec'd phone message from [place] @ office of Julie SwazilandJordan, NP @ [facility].  Reported that their office will order Lovenox bridge for pt., while Coumadin is on hold, prior to surgery for 1/18.

## 2015-05-08 ENCOUNTER — Encounter (HOSPITAL_COMMUNITY): Payer: Self-pay

## 2015-05-08 ENCOUNTER — Encounter (HOSPITAL_COMMUNITY)
Admission: RE | Admit: 2015-05-08 | Discharge: 2015-05-08 | Disposition: A | Discharge status: Home / Self Care | Payer: Medicare Other | Source: Ambulatory Visit | Attending: Vascular Surgery | Admitting: Vascular Surgery

## 2015-05-08 DIAGNOSIS — Z7901 Long term (current) use of anticoagulants: Secondary | ICD-10-CM | POA: Diagnosis not present

## 2015-05-08 DIAGNOSIS — Z01818 Encounter for other preprocedural examination: Secondary | ICD-10-CM | POA: Diagnosis not present

## 2015-05-08 DIAGNOSIS — Z8719 Personal history of other diseases of the digestive system: Secondary | ICD-10-CM | POA: Diagnosis not present

## 2015-05-08 DIAGNOSIS — I739 Peripheral vascular disease, unspecified: Secondary | ICD-10-CM | POA: Diagnosis not present

## 2015-05-08 DIAGNOSIS — Z79899 Other long term (current) drug therapy: Secondary | ICD-10-CM | POA: Diagnosis not present

## 2015-05-08 DIAGNOSIS — Z01812 Encounter for preprocedural laboratory examination: Secondary | ICD-10-CM | POA: Diagnosis not present

## 2015-05-08 DIAGNOSIS — I10 Essential (primary) hypertension: Secondary | ICD-10-CM | POA: Diagnosis not present

## 2015-05-08 DIAGNOSIS — Z0183 Encounter for blood typing: Secondary | ICD-10-CM | POA: Diagnosis not present

## 2015-05-08 DIAGNOSIS — Z7982 Long term (current) use of aspirin: Secondary | ICD-10-CM | POA: Diagnosis not present

## 2015-05-08 DIAGNOSIS — F17200 Nicotine dependence, unspecified, uncomplicated: Secondary | ICD-10-CM | POA: Insufficient documentation

## 2015-05-08 DIAGNOSIS — Z86711 Personal history of pulmonary embolism: Secondary | ICD-10-CM | POA: Insufficient documentation

## 2015-05-08 HISTORY — DX: Acute embolism and thrombosis of unspecified vein: I82.90

## 2015-05-08 LAB — CBC
HCT: 42.1 % (ref 39.0–52.0)
Hemoglobin: 14 g/dL (ref 13.0–17.0)
MCH: 30.8 pg (ref 26.0–34.0)
MCHC: 33.3 g/dL (ref 30.0–36.0)
MCV: 92.7 fL (ref 78.0–100.0)
PLATELETS: 123 10*3/uL — AB (ref 150–400)
RBC: 4.54 MIL/uL (ref 4.22–5.81)
RDW: 13.5 % (ref 11.5–15.5)
WBC: 7.3 10*3/uL (ref 4.0–10.5)

## 2015-05-08 LAB — ABO/RH: ABO/RH(D): B POS

## 2015-05-08 LAB — COMPREHENSIVE METABOLIC PANEL
ALBUMIN: 3.6 g/dL (ref 3.5–5.0)
ALT: 14 U/L — AB (ref 17–63)
AST: 20 U/L (ref 15–41)
Alkaline Phosphatase: 72 U/L (ref 38–126)
Anion gap: 8 (ref 5–15)
BUN: 11 mg/dL (ref 6–20)
CHLORIDE: 109 mmol/L (ref 101–111)
CO2: 22 mmol/L (ref 22–32)
CREATININE: 0.96 mg/dL (ref 0.61–1.24)
Calcium: 9 mg/dL (ref 8.9–10.3)
GFR calc Af Amer: >60 mL/min (ref >60)
GFR calc non Af Amer: >60 mL/min (ref >60)
Glucose, Bld: 99 mg/dL (ref 65–99)
Potassium: 4.4 mmol/L (ref 3.5–5.1)
SODIUM: 139 mmol/L (ref 135–145)
Total Bilirubin: 0.5 mg/dL (ref 0.3–1.2)
Total Protein: 7.2 g/dL (ref 6.5–8.1)

## 2015-05-08 LAB — SURGICAL PCR SCREEN
MRSA, PCR: NEGATIVE
Staphylococcus aureus: NEGATIVE

## 2015-05-08 LAB — BLOOD GAS, ARTERIAL
ACID-BASE DEFICIT: 0.9 mmol/L (ref 0.0–2.0)
Bicarbonate: 23.1 mEq/L (ref 20.0–24.0)
DRAWN BY: 421801
FIO2: 0.21
O2 SAT: 95.6 %
PATIENT TEMPERATURE: 98.6
PCO2 ART: 37.6 mmHg (ref 35.0–45.0)
TCO2: 24.3 mmol/L (ref 0–100)
pH, Arterial: 7.406 (ref 7.350–7.450)
pO2, Arterial: 73.5 mmHg — ABNORMAL LOW (ref 80.0–100.0)

## 2015-05-08 LAB — PROTIME-INR
INR: 2.11 — ABNORMAL HIGH (ref 0.00–1.49)
Prothrombin Time: 23.5 seconds — ABNORMAL HIGH (ref 11.6–15.2)

## 2015-05-08 LAB — URINALYSIS, ROUTINE W REFLEX MICROSCOPIC
GLUCOSE, UA: NEGATIVE mg/dL
Hgb urine dipstick: NEGATIVE
KETONES UR: NEGATIVE mg/dL
LEUKOCYTES UA: NEGATIVE
NITRITE: NEGATIVE
PROTEIN: NEGATIVE mg/dL
Specific Gravity, Urine: 1.026 (ref 1.005–1.030)
pH: 5.5 (ref 5.0–8.0)

## 2015-05-08 LAB — APTT: APTT: 36 s (ref 24–37)

## 2015-05-08 NOTE — Progress Notes (Signed)
PCP: Dr. Julie SwazilandJordan [facility] in [place] :per pt. He is to start lovenox today per Dr. SwazilandJordan.  Cardiologist: Dr. Arrie SenateJonathan Branch  Carol at Dr. Evelina DunField's office stated for pt. To continue taking aspirin.

## 2015-05-08 NOTE — Progress Notes (Signed)
Anesthesia Chart Review:  Pt is 65 year old male scheduled for aortobifemoral bypass graft on 05/13/2015 with Dr. Darrick PennaFields.   PCP is Julie SwazilandJordan, NP.   PMH includes:  HTN, alcoholic pancreatitis, PE. Current smoker. BMI 26.   Medications include: albuterol, ASA, coumadin. Pt to stop coumadin and start lovenox bridge 05/08/15.   Preoperative labs reviewed.  PT 23.5. Will recheck PT/PTT DOS.   EKG 04/29/15: sinus rhythm. Nonspecific T wave abnormality.   Nuclear stress test 05/06/15:   There was no ST segment deviation noted during stress.  Defect 1: There is a medium defect of mild severity present in the mid inferoseptal, mid inferior and apical inferior location. This is due to soft tissue attenuation artifact. No myocardial ischemia or scar.  This is a low risk study.  Nuclear stress EF: 64%.   Pt saw Dr. Dina RichJonathan Branch with cardiology for pre-op evaluation. Pt is clear for surgery in addendum to note dated 04/29/15.   If labs are acceptable DOS, I anticipate pt can proceed as scheduled.   Rica Mastngela Jullian Previti, FNP-BC [facility] Phone: [phone] 05/08/2015 3:36 PM

## 2015-05-08 NOTE — Pre-Procedure Instructions (Signed)
    [name]  05/08/2015      [id number] - [place] - [address] [place] [id number] Phone: [phone] Fax: [phone]  [facility] [place] - [address] [place] [id number] Phone: [phone] Fax: [phone]    Your procedure is scheduled on Wed. Jan. 18  [facility] Admitting at 6:30 A.M.  Call this number if you have problems the morning of surgery:  [phone]   Remember:  Do not eat food or drink liquids after midnight on Jan. 17   Take these medicines the morning of surgery with A SIP OF WATER :albuterol inhaler if needed- bring to hospital, gabapentin(neurontin), tramadol if needed, aspirin              Start lovenox per Dr. Raynelle FanningJulie Jordan's instructions today                Do not wear jewelry, make-up or nail polish.  Do not wear lotions, powders, or perfumes.  You may wear deodorant.  Do not shave 48 hours prior to surgery.  Men may shave face and neck.  Do not bring valuables to the hospital.  [facility] is not responsible for any belongings or valuables.  Contacts, dentures or bridgework may not be worn into surgery.  Leave your suitcase in the car.  After surgery it may be brought to your room.  For patients admitted to the hospital, discharge time will be determined by your treatment team.  Patients discharged the day of surgery will not be allowed to drive home.    Special instructions: review handouts  Please read over the following fact sheets that you were given. Pain Booklet, Coughing and Deep Breathing, Blood Transfusion Information, MRSA Information and Surgical Site Infection Prevention

## 2015-05-12 ENCOUNTER — Telehealth: Payer: Self-pay | Admitting: *Deleted

## 2015-05-12 NOTE — Telephone Encounter (Signed)
-----   Message from Antoine Poche, MD sent at 05/12/2015 10:53 AM EST ----- 3 months  JB ----- Message -----    From: Albertine Patricia, CMA    Sent: 05/12/2015   9:25 AM      To: Antoine Poche, MD  When did you want to f/u with this pt?  Kaneisha Ellenberger

## 2015-05-12 NOTE — Telephone Encounter (Signed)
F/u appt scheduled

## 2015-05-12 NOTE — Anesthesia Preprocedure Evaluation (Addendum)
Anesthesia Evaluation  Patient identified by MRN, date of birth, ID band Patient awake    Reviewed: Allergy & Precautions, NPO status , Patient's Chart, lab work & pertinent test results  Airway Mallampati: I  TM Distance: >3 FB Neck ROM: Full    Dental  (+) Edentulous Upper, Edentulous Lower   Pulmonary Current Smoker,    breath sounds clear to auscultation       Cardiovascular hypertension, + Peripheral Vascular Disease   Rhythm:Regular Rate:Normal     Neuro/Psych negative neurological ROS  negative psych ROS   GI/Hepatic negative GI ROS, Neg liver ROS,   Endo/Other  negative endocrine ROS  Renal/GU negative Renal ROS  negative genitourinary   Musculoskeletal negative musculoskeletal ROS (+)   Abdominal   Peds negative pediatric ROS (+)  Hematology negative hematology ROS (+)   Anesthesia Other Findings   Reproductive/Obstetrics negative OB ROS                           Lab Results  Component Value Date   WBC 7.3 05/08/2015   HGB 14.0 05/08/2015   HCT 42.1 05/08/2015   MCV 92.7 05/08/2015   PLT 123* 05/08/2015   Lab Results  Component Value Date   CREATININE 0.96 05/08/2015   BUN 11 05/08/2015   NA 139 05/08/2015   K 4.4 05/08/2015   CL 109 05/08/2015   CO2 22 05/08/2015   Lab Results  Component Value Date   INR 2.11* 05/08/2015   INR 2.26* 05/21/2011   INR 1.00 02/18/2011   04/2015 EKG: normal sinus rhythm.   Anesthesia Physical Anesthesia Plan  ASA: III  Anesthesia Plan: General   Post-op Pain Management:    Induction: Intravenous  Airway Management Planned:   Additional Equipment: Arterial line and CVP  Intra-op Plan:   Post-operative Plan: Extubation in OR  Informed Consent: I have reviewed the patients History and Physical, chart, labs and discussed the procedure including the risks, benefits and alternatives for the proposed anesthesia with the  patient or authorized representative who has indicated his/her understanding and acceptance.   Dental advisory given  Plan Discussed with: CRNA  Anesthesia Plan Comments:        Anesthesia Quick Evaluation

## 2015-05-13 ENCOUNTER — Inpatient Hospital Stay (HOSPITAL_COMMUNITY): Payer: Medicare Other | Admitting: Certified Registered Nurse Anesthetist

## 2015-05-13 ENCOUNTER — Inpatient Hospital Stay (HOSPITAL_COMMUNITY)
Admission: RE | Admit: 2015-05-13 | Discharge: 2015-05-19 | DRG: 271 | Disposition: A | Discharge status: Home / Self Care | Payer: Medicare Other | Source: Ambulatory Visit | Attending: Vascular Surgery | Admitting: Vascular Surgery

## 2015-05-13 ENCOUNTER — Encounter (HOSPITAL_COMMUNITY): Payer: Self-pay | Admitting: General Practice

## 2015-05-13 ENCOUNTER — Encounter (HOSPITAL_COMMUNITY)
Admission: RE | Disposition: A | Discharge status: Home / Self Care | Payer: Medicare Other | Source: Ambulatory Visit | Attending: Vascular Surgery

## 2015-05-13 ENCOUNTER — Inpatient Hospital Stay (HOSPITAL_COMMUNITY): Payer: Medicare Other | Admitting: Emergency Medicine

## 2015-05-13 ENCOUNTER — Inpatient Hospital Stay (HOSPITAL_COMMUNITY): Payer: Medicare Other

## 2015-05-13 DIAGNOSIS — D62 Acute posthemorrhagic anemia: Secondary | ICD-10-CM | POA: Diagnosis not present

## 2015-05-13 DIAGNOSIS — I10 Essential (primary) hypertension: Secondary | ICD-10-CM | POA: Diagnosis present

## 2015-05-13 DIAGNOSIS — I7409 Other arterial embolism and thrombosis of abdominal aorta: Secondary | ICD-10-CM | POA: Diagnosis present

## 2015-05-13 DIAGNOSIS — Z79899 Other long term (current) drug therapy: Secondary | ICD-10-CM

## 2015-05-13 DIAGNOSIS — Z881 Allergy status to other antibiotic agents status: Secondary | ICD-10-CM | POA: Diagnosis not present

## 2015-05-13 DIAGNOSIS — N390 Urinary tract infection, site not specified: Secondary | ICD-10-CM | POA: Diagnosis not present

## 2015-05-13 DIAGNOSIS — L89512 Pressure ulcer of right ankle, stage 2: Secondary | ICD-10-CM | POA: Diagnosis present

## 2015-05-13 DIAGNOSIS — Z95828 Presence of other vascular implants and grafts: Secondary | ICD-10-CM

## 2015-05-13 DIAGNOSIS — F17210 Nicotine dependence, cigarettes, uncomplicated: Secondary | ICD-10-CM | POA: Diagnosis present

## 2015-05-13 DIAGNOSIS — L8990 Pressure ulcer of unspecified site, unspecified stage: Secondary | ICD-10-CM | POA: Insufficient documentation

## 2015-05-13 DIAGNOSIS — Z86718 Personal history of other venous thrombosis and embolism: Secondary | ICD-10-CM

## 2015-05-13 DIAGNOSIS — I739 Peripheral vascular disease, unspecified: Secondary | ICD-10-CM | POA: Diagnosis present

## 2015-05-13 DIAGNOSIS — Z7901 Long term (current) use of anticoagulants: Secondary | ICD-10-CM | POA: Diagnosis not present

## 2015-05-13 DIAGNOSIS — Z86711 Personal history of pulmonary embolism: Secondary | ICD-10-CM | POA: Diagnosis not present

## 2015-05-13 DIAGNOSIS — I70213 Atherosclerosis of native arteries of extremities with intermittent claudication, bilateral legs: Principal | ICD-10-CM | POA: Diagnosis present

## 2015-05-13 DIAGNOSIS — K635 Polyp of colon: Secondary | ICD-10-CM | POA: Diagnosis present

## 2015-05-13 DIAGNOSIS — I70339 Atherosclerosis of unspecified type of bypass graft(s) of the right leg with ulceration of unspecified site: Secondary | ICD-10-CM

## 2015-05-13 HISTORY — PX: AORTA - BILATERAL FEMORAL ARTERY BYPASS GRAFT: SHX1175

## 2015-05-13 LAB — POCT I-STAT 7, (LYTES, BLD GAS, ICA,H+H)
Acid-base deficit: 3 mmol/L — ABNORMAL HIGH (ref 0.0–2.0)
BICARBONATE: 20.5 meq/L (ref 20.0–24.0)
Calcium, Ion: 1.11 mmol/L — ABNORMAL LOW (ref 1.13–1.30)
HCT: 33 % — ABNORMAL LOW (ref 39.0–52.0)
Hemoglobin: 11.2 g/dL — ABNORMAL LOW (ref 13.0–17.0)
O2 SAT: 100 %
PCO2 ART: 28.9 mmHg — AB (ref 35.0–45.0)
PO2 ART: 169 mmHg — AB (ref 80.0–100.0)
Patient temperature: 35.5
Potassium: 3.7 mmol/L (ref 3.5–5.1)
Sodium: 138 mmol/L (ref 135–145)
TCO2: 21 mmol/L (ref 0–100)
pH, Arterial: 7.452 — ABNORMAL HIGH (ref 7.350–7.450)

## 2015-05-13 LAB — BASIC METABOLIC PANEL
ANION GAP: 9 (ref 5–15)
BUN: 9 mg/dL (ref 6–20)
CALCIUM: 8.3 mg/dL — AB (ref 8.9–10.3)
CO2: 22 mmol/L (ref 22–32)
Chloride: 104 mmol/L (ref 101–111)
Creatinine, Ser: 0.65 mg/dL (ref 0.61–1.24)
Glucose, Bld: 162 mg/dL — ABNORMAL HIGH (ref 65–99)
POTASSIUM: 3.9 mmol/L (ref 3.5–5.1)
SODIUM: 135 mmol/L (ref 135–145)

## 2015-05-13 LAB — CBC
HCT: 33.8 % — ABNORMAL LOW (ref 39.0–52.0)
Hemoglobin: 11.1 g/dL — ABNORMAL LOW (ref 13.0–17.0)
MCH: 30.2 pg (ref 26.0–34.0)
MCHC: 32.8 g/dL (ref 30.0–36.0)
MCV: 91.8 fL (ref 78.0–100.0)
PLATELETS: 89 10*3/uL — AB (ref 150–400)
RBC: 3.68 MIL/uL — AB (ref 4.22–5.81)
RDW: 13.2 % (ref 11.5–15.5)
WBC: 10.7 10*3/uL — AB (ref 4.0–10.5)

## 2015-05-13 LAB — MAGNESIUM: Magnesium: 1.3 mg/dL — ABNORMAL LOW (ref 1.7–2.4)

## 2015-05-13 LAB — GLUCOSE, CAPILLARY: GLUCOSE-CAPILLARY: 137 mg/dL — AB (ref 65–99)

## 2015-05-13 LAB — APTT
APTT: 30 s (ref 24–37)
aPTT: 28 seconds (ref 24–37)

## 2015-05-13 LAB — PREPARE RBC (CROSSMATCH)

## 2015-05-13 LAB — PROTIME-INR
INR: 1.15 (ref 0.00–1.49)
INR: 1.27 (ref 0.00–1.49)
PROTHROMBIN TIME: 16.1 s — AB (ref 11.6–15.2)
Prothrombin Time: 14.9 seconds (ref 11.6–15.2)

## 2015-05-13 SURGERY — CREATION, BYPASS, ARTERIAL, AORTA TO FEMORAL, BILATERAL, USING GRAFT
Anesthesia: General | Site: Abdomen | Laterality: Bilateral

## 2015-05-13 MED ORDER — HYDROMORPHONE HCL 1 MG/ML IJ SOLN
INTRAMUSCULAR | Status: AC
Start: 2015-05-13 — End: 2015-05-14
  Filled 2015-05-13: qty 1

## 2015-05-13 MED ORDER — MEPERIDINE HCL 25 MG/ML IJ SOLN: 6.2500 mg | INTRAMUSCULAR | Status: DC | PRN

## 2015-05-13 MED ORDER — SODIUM CHLORIDE 0.9 % IV SOLN: 10.0000 mL/h | Freq: Once | INTRAVENOUS | Status: DC

## 2015-05-13 MED ORDER — ALBUTEROL SULFATE (2.5 MG/3ML) 0.083% IN NEBU: 2.5000 mg | INHALATION_SOLUTION | Freq: Four times a day (QID) | RESPIRATORY_TRACT | Status: DC | PRN

## 2015-05-13 MED ORDER — SUCCINYLCHOLINE CHLORIDE 20 MG/ML IJ SOLN
INTRAMUSCULAR | Status: DC | PRN
  Administered 2015-05-13: 120 mg via INTRAVENOUS

## 2015-05-13 MED ORDER — ROCURONIUM BROMIDE 50 MG/5ML IV SOLN
INTRAVENOUS | Status: AC
  Filled 2015-05-13: qty 4

## 2015-05-13 MED ORDER — DEXTROSE 5 % IV SOLN
1.5000 g | INTRAVENOUS | Status: AC
  Administered 2015-05-13 (×2): 1.5 g via INTRAVENOUS
  Filled 2015-05-13: qty 1.5

## 2015-05-13 MED ORDER — FENTANYL CITRATE (PF) 250 MCG/5ML IJ SOLN
INTRAMUSCULAR | Status: AC
  Filled 2015-05-13: qty 10

## 2015-05-13 MED ORDER — GLYCOPYRROLATE 0.2 MG/ML IJ SOLN
INTRAMUSCULAR | Status: DC | PRN
  Administered 2015-05-13: 0.6 mg via INTRAVENOUS

## 2015-05-13 MED ORDER — HEPARIN SODIUM (PORCINE) 1000 UNIT/ML IJ SOLN
INTRAMUSCULAR | Status: AC
  Filled 2015-05-13: qty 1

## 2015-05-13 MED ORDER — ALBUMIN HUMAN 5 % IV SOLN
INTRAVENOUS | Status: DC | PRN
  Administered 2015-05-13 (×3): via INTRAVENOUS

## 2015-05-13 MED ORDER — LACTATED RINGERS IV SOLN
INTRAVENOUS | Status: DC | PRN
  Administered 2015-05-13 (×2): via INTRAVENOUS

## 2015-05-13 MED ORDER — DEXTROSE 5 % IV SOLN
INTRAVENOUS | Status: AC
  Filled 2015-05-13: qty 1.5

## 2015-05-13 MED ORDER — ONDANSETRON HCL 4 MG/2ML IJ SOLN
INTRAMUSCULAR | Status: AC
  Filled 2015-05-13: qty 2

## 2015-05-13 MED ORDER — LACTATED RINGERS IV SOLN
INTRAVENOUS | Status: DC
Start: 2015-05-13 — End: 2015-05-13

## 2015-05-13 MED ORDER — FENTANYL CITRATE (PF) 100 MCG/2ML IJ SOLN
INTRAMUSCULAR | Status: DC | PRN
  Administered 2015-05-13: 25 ug via INTRAVENOUS
  Administered 2015-05-13: 50 ug via INTRAVENOUS
  Administered 2015-05-13: 100 ug via INTRAVENOUS
  Administered 2015-05-13: 50 ug via INTRAVENOUS
  Administered 2015-05-13: 25 ug via INTRAVENOUS
  Administered 2015-05-13 (×8): 50 ug via INTRAVENOUS

## 2015-05-13 MED ORDER — EPHEDRINE SULFATE 50 MG/ML IJ SOLN
INTRAMUSCULAR | Status: AC
  Filled 2015-05-13: qty 1

## 2015-05-13 MED ORDER — HYDRALAZINE HCL 20 MG/ML IJ SOLN
5.0000 mg | INTRAMUSCULAR | Status: DC | PRN
Start: 2015-05-13 — End: 2015-05-19

## 2015-05-13 MED ORDER — MAGNESIUM SULFATE 2 GM/50ML IV SOLN
2.0000 g | Freq: Every day | INTRAVENOUS | Status: AC | PRN
  Administered 2015-05-13: 2 g via INTRAVENOUS
  Filled 2015-05-13: qty 50

## 2015-05-13 MED ORDER — PANTOPRAZOLE SODIUM 40 MG IV SOLR
40.0000 mg | Freq: Every day | INTRAVENOUS | Status: DC
  Administered 2015-05-13 – 2015-05-17 (×5): 40 mg via INTRAVENOUS
  Filled 2015-05-13 (×5): qty 40

## 2015-05-13 MED ORDER — MIDAZOLAM HCL 5 MG/5ML IJ SOLN
INTRAMUSCULAR | Status: DC | PRN
  Administered 2015-05-13 (×2): 1 mg via INTRAVENOUS

## 2015-05-13 MED ORDER — NEOSTIGMINE METHYLSULFATE 10 MG/10ML IV SOLN
INTRAVENOUS | Status: AC
  Filled 2015-05-13: qty 3

## 2015-05-13 MED ORDER — LACTATED RINGERS IV SOLN
INTRAVENOUS | Status: DC | PRN
  Administered 2015-05-13 (×3): via INTRAVENOUS

## 2015-05-13 MED ORDER — NITROGLYCERIN 0.2 MG/ML ON CALL CATH LAB
INTRAVENOUS | Status: DC | PRN
  Administered 2015-05-13 (×2): 40 ug via INTRAVENOUS
  Administered 2015-05-13: 50 ug via INTRAVENOUS
  Administered 2015-05-13 (×2): 40 ug via INTRAVENOUS
  Administered 2015-05-13: 80 ug via INTRAVENOUS

## 2015-05-13 MED ORDER — DEXTROSE 5 % IV SOLN
1.5000 g | Freq: Two times a day (BID) | INTRAVENOUS | Status: AC
  Administered 2015-05-14 (×2): 1.5 g via INTRAVENOUS
  Filled 2015-05-13 (×3): qty 1.5

## 2015-05-13 MED ORDER — MORPHINE SULFATE (PF) 2 MG/ML IV SOLN
2.0000 mg | INTRAVENOUS | Status: DC | PRN
  Administered 2015-05-13 – 2015-05-14 (×7): 4 mg via INTRAVENOUS
  Filled 2015-05-13 (×7): qty 2

## 2015-05-13 MED ORDER — PHENYLEPHRINE 40 MCG/ML (10ML) SYRINGE FOR IV PUSH (FOR BLOOD PRESSURE SUPPORT)
PREFILLED_SYRINGE | INTRAVENOUS | Status: AC
  Filled 2015-05-13: qty 10

## 2015-05-13 MED ORDER — DEXAMETHASONE SODIUM PHOSPHATE 4 MG/ML IJ SOLN
INTRAMUSCULAR | Status: AC
  Filled 2015-05-13: qty 2

## 2015-05-13 MED ORDER — PROPOFOL 10 MG/ML IV BOLUS
INTRAVENOUS | Status: DC | PRN
  Administered 2015-05-13: 150 mg via INTRAVENOUS
  Administered 2015-05-13: 50 mg via INTRAVENOUS

## 2015-05-13 MED ORDER — SODIUM CHLORIDE 0.9 % IJ SOLN
INTRAMUSCULAR | Status: AC
  Filled 2015-05-13: qty 30

## 2015-05-13 MED ORDER — METOPROLOL TARTRATE 1 MG/ML IV SOLN
2.0000 mg | INTRAVENOUS | Status: DC | PRN
  Administered 2015-05-18: 5 mg via INTRAVENOUS
  Filled 2015-05-13: qty 5

## 2015-05-13 MED ORDER — PROTAMINE SULFATE 10 MG/ML IV SOLN
INTRAVENOUS | Status: DC | PRN
  Administered 2015-05-13: 70 mg via INTRAVENOUS

## 2015-05-13 MED ORDER — ARTIFICIAL TEARS OP OINT
TOPICAL_OINTMENT | OPHTHALMIC | Status: AC
  Filled 2015-05-13: qty 3.5

## 2015-05-13 MED ORDER — KCL IN DEXTROSE-NACL 20-5-0.45 MEQ/L-%-% IV SOLN
INTRAVENOUS | Status: DC
  Administered 2015-05-13 – 2015-05-15 (×4): via INTRAVENOUS
  Filled 2015-05-13 (×6): qty 1000

## 2015-05-13 MED ORDER — SODIUM CHLORIDE 0.9 % IV SOLN
INTRAVENOUS | Status: DC | PRN
  Administered 2015-05-13: 500 mL

## 2015-05-13 MED ORDER — GUAIFENESIN-DM 100-10 MG/5ML PO SYRP: 15.0000 mL | ORAL_SOLUTION | ORAL | Status: DC | PRN

## 2015-05-13 MED ORDER — LIDOCAINE HCL (CARDIAC) 20 MG/ML IV SOLN
INTRAVENOUS | Status: DC | PRN
  Administered 2015-05-13: 40 mg via INTRAVENOUS

## 2015-05-13 MED ORDER — SODIUM CHLORIDE 0.9 % IV SOLN
INTRAVENOUS | Status: DC
  Administered 2015-05-13 (×2): via INTRAVENOUS

## 2015-05-13 MED ORDER — DOCUSATE SODIUM 100 MG PO CAPS
100.0000 mg | ORAL_CAPSULE | Freq: Every day | ORAL | Status: DC
Start: 2015-05-14 — End: 2015-05-19
  Administered 2015-05-18: 100 mg via ORAL
  Filled 2015-05-13 (×3): qty 1

## 2015-05-13 MED ORDER — NEOSTIGMINE METHYLSULFATE 10 MG/10ML IV SOLN
INTRAVENOUS | Status: DC | PRN
  Administered 2015-05-13: 4 mg via INTRAVENOUS

## 2015-05-13 MED ORDER — LABETALOL HCL 5 MG/ML IV SOLN: 10.0000 mg | INTRAVENOUS | Status: DC | PRN

## 2015-05-13 MED ORDER — CHLORHEXIDINE GLUCONATE CLOTH 2 % EX PADS: 6.0000 | MEDICATED_PAD | Freq: Once | CUTANEOUS | Status: DC

## 2015-05-13 MED ORDER — PHENOL 1.4 % MT LIQD: 1.0000 | OROMUCOSAL | Status: DC | PRN

## 2015-05-13 MED ORDER — NITROGLYCERIN IN D5W 200-5 MCG/ML-% IV SOLN
INTRAVENOUS | Status: DC | PRN
  Administered 2015-05-13: 50 ug/min via INTRAVENOUS

## 2015-05-13 MED ORDER — THROMBIN 20000 UNITS EX SOLR
CUTANEOUS | Status: AC
  Filled 2015-05-13: qty 20000

## 2015-05-13 MED ORDER — PHENYLEPHRINE HCL 10 MG/ML IJ SOLN
10.0000 mg | INTRAMUSCULAR | Status: DC | PRN
  Administered 2015-05-13 (×2): 10 ug/min via INTRAVENOUS

## 2015-05-13 MED ORDER — ONDANSETRON HCL 4 MG/2ML IJ SOLN: 4.0000 mg | Freq: Four times a day (QID) | INTRAMUSCULAR | Status: DC | PRN

## 2015-05-13 MED ORDER — SODIUM CHLORIDE 0.9 % IV SOLN: 500.0000 mL | Freq: Once | INTRAVENOUS | Status: DC | PRN

## 2015-05-13 MED ORDER — FENTANYL CITRATE (PF) 250 MCG/5ML IJ SOLN
INTRAMUSCULAR | Status: AC
  Filled 2015-05-13: qty 5

## 2015-05-13 MED ORDER — PHENYLEPHRINE HCL 10 MG/ML IJ SOLN
INTRAMUSCULAR | Status: DC | PRN
  Administered 2015-05-13 (×2): 40 ug via INTRAVENOUS

## 2015-05-13 MED ORDER — ALUM & MAG HYDROXIDE-SIMETH 200-200-20 MG/5ML PO SUSP: 15.0000 mL | ORAL | Status: DC | PRN

## 2015-05-13 MED ORDER — POTASSIUM CHLORIDE CRYS ER 20 MEQ PO TBCR: 20.0000 meq | EXTENDED_RELEASE_TABLET | Freq: Every day | ORAL | Status: DC | PRN

## 2015-05-13 MED ORDER — MIDAZOLAM HCL 2 MG/2ML IJ SOLN
INTRAMUSCULAR | Status: AC
  Filled 2015-05-13: qty 2

## 2015-05-13 MED ORDER — PROPOFOL 10 MG/ML IV BOLUS
INTRAVENOUS | Status: AC
  Filled 2015-05-13: qty 40

## 2015-05-13 MED ORDER — LIDOCAINE HCL (CARDIAC) 20 MG/ML IV SOLN
INTRAVENOUS | Status: AC
  Filled 2015-05-13: qty 5

## 2015-05-13 MED ORDER — ROCURONIUM BROMIDE 100 MG/10ML IV SOLN
INTRAVENOUS | Status: DC | PRN
  Administered 2015-05-13: 5 mg via INTRAVENOUS
  Administered 2015-05-13 (×3): 20 mg via INTRAVENOUS
  Administered 2015-05-13: 10 mg via INTRAVENOUS
  Administered 2015-05-13: 5 mg via INTRAVENOUS
  Administered 2015-05-13: 50 mg via INTRAVENOUS
  Administered 2015-05-13: 5 mg via INTRAVENOUS
  Administered 2015-05-13: 20 mg via INTRAVENOUS
  Administered 2015-05-13: 5 mg via INTRAVENOUS
  Administered 2015-05-13: 10 mg via INTRAVENOUS

## 2015-05-13 MED ORDER — GLYCOPYRROLATE 0.2 MG/ML IJ SOLN
INTRAMUSCULAR | Status: AC
  Filled 2015-05-13: qty 3

## 2015-05-13 MED ORDER — PROMETHAZINE HCL 25 MG/ML IJ SOLN: 6.2500 mg | INTRAMUSCULAR | Status: DC | PRN

## 2015-05-13 MED ORDER — HYDROMORPHONE HCL 1 MG/ML IJ SOLN
0.2500 mg | INTRAMUSCULAR | Status: DC | PRN
  Administered 2015-05-13: 1 mg via INTRAVENOUS

## 2015-05-13 MED ORDER — HEPARIN SODIUM (PORCINE) 1000 UNIT/ML IJ SOLN
INTRAMUSCULAR | Status: AC
  Filled 2015-05-13: qty 2

## 2015-05-13 MED ORDER — ACETAMINOPHEN 325 MG PO TABS
325.0000 mg | ORAL_TABLET | ORAL | Status: DC | PRN
  Administered 2015-05-16: 650 mg via ORAL
  Filled 2015-05-13: qty 2

## 2015-05-13 MED ORDER — SUCCINYLCHOLINE CHLORIDE 20 MG/ML IJ SOLN
INTRAMUSCULAR | Status: AC
  Filled 2015-05-13: qty 1

## 2015-05-13 MED ORDER — 0.9 % SODIUM CHLORIDE (POUR BTL) OPTIME
TOPICAL | Status: DC | PRN
  Administered 2015-05-13: 3000 mL

## 2015-05-13 MED ORDER — ALBUTEROL SULFATE HFA 108 (90 BASE) MCG/ACT IN AERS: 2.0000 | INHALATION_SPRAY | Freq: Four times a day (QID) | RESPIRATORY_TRACT | Status: DC | PRN

## 2015-05-13 MED ORDER — ACETAMINOPHEN 650 MG RE SUPP: 325.0000 mg | RECTAL | Status: DC | PRN

## 2015-05-13 MED ORDER — SODIUM CHLORIDE 0.9 % IV SOLN
0.1500 ug/kg/min | INTRAVENOUS | Status: AC
  Administered 2015-05-13: .05 ug/kg/min via INTRAVENOUS
  Filled 2015-05-13: qty 2000

## 2015-05-13 MED ORDER — LABETALOL HCL 5 MG/ML IV SOLN
INTRAVENOUS | Status: DC | PRN
  Administered 2015-05-13: 5 mg via INTRAVENOUS

## 2015-05-13 MED ORDER — ROCURONIUM BROMIDE 50 MG/5ML IV SOLN
INTRAVENOUS | Status: AC
  Filled 2015-05-13: qty 1

## 2015-05-13 MED ORDER — DEXAMETHASONE SODIUM PHOSPHATE 4 MG/ML IJ SOLN
INTRAMUSCULAR | Status: DC | PRN
  Administered 2015-05-13: 8 mg via INTRAVENOUS

## 2015-05-13 MED ORDER — PROTAMINE SULFATE 10 MG/ML IV SOLN
INTRAVENOUS | Status: AC
  Filled 2015-05-13: qty 10

## 2015-05-13 MED ORDER — HEPARIN SODIUM (PORCINE) 1000 UNIT/ML IJ SOLN
INTRAMUSCULAR | Status: DC | PRN
  Administered 2015-05-13 (×3): 7000 [IU] via INTRAVENOUS

## 2015-05-13 SURGICAL SUPPLY — 65 items
ATTRACTOMAT 16X20 MAGNETIC DRP (DRAPES) ×1 IMPLANT
BAG ISL DRAPE 18X18 STRL (DRAPES) ×2
BAG ISOLATION DRAPE 18X18 (DRAPES) ×2 IMPLANT
CANISTER SUCTION 2500CC (MISCELLANEOUS) ×2 IMPLANT
CANNULA VESSEL 3MM 2 BLNT TIP (CANNULA) ×3 IMPLANT
CATH EMB 3FR 80CM (CATHETERS) ×1 IMPLANT
CLIP TI MEDIUM 24 (CLIP) ×2 IMPLANT
CLIP TI WIDE RED SMALL 24 (CLIP) ×2 IMPLANT
DRAPE ISOLATION BAG 18X18 (DRAPES) ×2
DRSG COVADERM 4X14 (GAUZE/BANDAGES/DRESSINGS) ×1 IMPLANT
DRSG COVADERM 4X6 (GAUZE/BANDAGES/DRESSINGS) ×2 IMPLANT
ELECT BLADE 6.5 EXT (BLADE) ×1 IMPLANT
ELECT REM PT RETURN 9FT ADLT (ELECTROSURGICAL) ×2
ELECTRODE REM PT RTRN 9FT ADLT (ELECTROSURGICAL) ×1 IMPLANT
GLOVE BIO SURGEON STRL SZ 6.5 (GLOVE) ×3 IMPLANT
GLOVE BIO SURGEON STRL SZ7.5 (GLOVE) ×3 IMPLANT
GLOVE BIOGEL PI IND STRL 6.5 (GLOVE) IMPLANT
GLOVE BIOGEL PI IND STRL 8 (GLOVE) IMPLANT
GLOVE BIOGEL PI INDICATOR 6.5 (GLOVE) ×8
GLOVE BIOGEL PI INDICATOR 8 (GLOVE) ×1
GLOVE ECLIPSE 6.5 STRL STRAW (GLOVE) ×3 IMPLANT
GLOVE SS BIOGEL STRL SZ 7.5 (GLOVE) IMPLANT
GLOVE SUPERSENSE BIOGEL SZ 7.5 (GLOVE) ×1
GOWN STRL REUS W/ TWL LRG LVL3 (GOWN DISPOSABLE) ×3 IMPLANT
GOWN STRL REUS W/TWL LRG LVL3 (GOWN DISPOSABLE) ×16
GOWN STRL REUS W/TWL XL LVL3 (GOWN DISPOSABLE) ×1 IMPLANT
GRAFT HEMASHIELD 14X8MM (Vascular Products) ×1 IMPLANT
INSERT FOGARTY 61MM (MISCELLANEOUS) ×3 IMPLANT
INSERT FOGARTY SM (MISCELLANEOUS) ×6 IMPLANT
KIT BASIN OR (CUSTOM PROCEDURE TRAY) ×2 IMPLANT
KIT ROOM TURNOVER OR (KITS) ×2 IMPLANT
LIQUID BAND (GAUZE/BANDAGES/DRESSINGS) ×2 IMPLANT
LOOP VESSEL MAXI BLUE (MISCELLANEOUS) ×1 IMPLANT
LOOP VESSEL MINI RED (MISCELLANEOUS) ×3 IMPLANT
NS IRRIG 1000ML POUR BTL (IV SOLUTION) ×5 IMPLANT
PACK AORTA (CUSTOM PROCEDURE TRAY) ×2 IMPLANT
PAD ARMBOARD 7.5X6 YLW CONV (MISCELLANEOUS) ×4 IMPLANT
RETAINER VISCERA MED (MISCELLANEOUS) ×2 IMPLANT
SLEEVE SURGEON STRL (DRAPES) ×1 IMPLANT
SPONGE LAP 18X18 X RAY DECT (DISPOSABLE) IMPLANT
SPONGE SURGIFOAM ABS GEL 100 (HEMOSTASIS) IMPLANT
STAPLER VISISTAT 35W (STAPLE) ×4 IMPLANT
SUT PDS AB 1 TP1 54 (SUTURE) ×4 IMPLANT
SUT PROLENE 3 0 SH DA (SUTURE) ×6 IMPLANT
SUT PROLENE 3 0 SH1 36 (SUTURE) ×5 IMPLANT
SUT PROLENE 5 0 C 1 24 (SUTURE) ×5 IMPLANT
SUT PROLENE 6 0 C 1 30 (SUTURE) ×2 IMPLANT
SUT PROLENE 6 0 CC (SUTURE) ×5 IMPLANT
SUT SILK 2 0 (SUTURE) ×4
SUT SILK 2 0 TIES 17X18 (SUTURE) ×2
SUT SILK 2 0SH CR/8 30 (SUTURE) ×2 IMPLANT
SUT SILK 2-0 18XBRD TIE 12 (SUTURE) ×1 IMPLANT
SUT SILK 2-0 18XBRD TIE BLK (SUTURE) ×1 IMPLANT
SUT SILK 3 0 (SUTURE) ×2
SUT SILK 3 0 TIES 17X18 (SUTURE) ×2
SUT SILK 3-0 18XBRD TIE 12 (SUTURE) ×1 IMPLANT
SUT SILK 3-0 18XBRD TIE BLK (SUTURE) ×1 IMPLANT
SUT VIC AB 2-0 CT1 27 (SUTURE) ×4
SUT VIC AB 2-0 CT1 TAPERPNT 27 (SUTURE) IMPLANT
SUT VIC AB 3-0 SH 27 (SUTURE) ×6
SUT VIC AB 3-0 SH 27X BRD (SUTURE) ×2 IMPLANT
SUT VIC AB 4-0 PS2 27 (SUTURE) ×2 IMPLANT
SYRINGE 3CC LL L/F (MISCELLANEOUS) ×1 IMPLANT
TRAY FOLEY W/METER SILVER 16FR (SET/KITS/TRAYS/PACK) ×2 IMPLANT
WATER STERILE IRR 1000ML POUR (IV SOLUTION) ×4 IMPLANT

## 2015-05-13 NOTE — Anesthesia Procedure Notes (Addendum)
Central Venous Catheter Insertion Performed by: anesthesiologist 05/13/2015 7:55 AM Patient location: Pre-op. Preanesthetic checklist: patient identified, IV checked, site marked, risks and benefits discussed, surgical consent, monitors and equipment checked, pre-op evaluation, timeout performed and anesthesia consent Lidocaine 1% used for infiltration Landmarks identified Catheter size: 8 Fr Central line was placed.Double lumen Procedure performed using ultrasound guided technique. Attempts: 1 Following insertion, dressing applied and line sutured. Post procedure assessment: blood return through all ports. Patient tolerated the procedure well with no immediate complications. Additional procedure comments: No immediate complications noted. .    Procedure Name: Intubation Date/Time: 05/13/2015 10:00 AM Performed by: Daiva Eves Pre-anesthesia Checklist: Patient identified, Timeout performed, Suction available, Patient being monitored and Emergency Drugs available Patient Re-evaluated:Patient Re-evaluated prior to inductionOxygen Delivery Method: Circle system utilized Preoxygenation: Pre-oxygenation with 100% oxygen Intubation Type: IV induction Ventilation: Mask ventilation without difficulty Laryngoscope Size: Mac and 3 Grade View: Grade I Tube size: 7.5 mm Number of attempts: 1 Airway Equipment and Method: Stylet Placement Confirmation: ETT inserted through vocal cords under direct vision,  breath sounds checked- equal and bilateral,  positive ETCO2 and CO2 detector Secured at: 22 cm Tube secured with: Tape

## 2015-05-13 NOTE — H&P (View-Only) (Signed)
[facility] [place] HISTORY AND PHYSICAL    History of Present Illness:  Patient is a 65 y.o. male who presents for evaluation of  A nonhealing wound right foot.  He was seen approximate 2 weeks ago. At the time of that evaluation his physical exam suggested aortoiliac occlusive disease.  He returns today for follow-up after recent cardiology evaluation as well as a CT angiogram of the abdomen and pelvis with runoff.  The patient developed a blister over the right lateral malleolus 3-4 months ago. This has slowly enlarged despite going to [facility].  It is unchanged over the last 2 weeks.He also has short distance claudication in this leg at 1 block. He denies rest pain in the foot. He does have shortness of breath with exertion. He denies chest pain. He denies any prior operations on his leg except for a right hip fracture several years ago. 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 He is on Coumadin for this. This is frequently checked by Dr. Julie SwazilandJordan in [place].   He currently smokes one half pack of cigarettes per day. He states he is ready to quit. Greater than 3 minutes today were spent regarding smoking cessation.  Other medical problems include  Hypertension and prior history of alcoholic pancreatitis. These are both currently stable.    Past Medical History   Diagnosis  Date   .  Pancreatitis, alcoholic     .  Hypertension         Past Surgical History   Procedure  Laterality  Date   .  Hip surgery           right     Social History Social History   Substance Use Topics   .  Smoking status:  Current Every Day Smoker       Types:  Cigarettes   .  Smokeless tobacco:  Never Used   .  Alcohol Use:  No     Family History History reviewed. No pertinent family history.  Allergies    Allergies   Allergen  Reactions   .  Sulfa Antibiotics  Itching and Rash        Current Outpatient Prescriptions   Medication   Sig  Dispense  Refill   .  amLODipine (NORVASC) 10 MG tablet  Take 10 mg by mouth daily.       .  diclofenac sodium (VOLTAREN) 1 % GEL  Apply topically 4 (four) times daily.       Marland Kitchen.  gabapentin (NEURONTIN) 100 MG capsule  Take 100 mg by mouth 3 (three) times daily.       .  traMADol (ULTRAM) 50 MG tablet  Take 50 mg by mouth every 6 (six) hours as needed. Pain       .  warfarin (COUMADIN) 4 MG tablet  Take 8 mg by mouth daily.          No current facility-administered medications for this visit.     ROS:    General:  No weight loss, Fever, chills  HEENT: No recent headaches, no nasal bleeding, no visual changes, no sore throat  Neurologic: No dizziness, blackouts, seizures. No recent symptoms of stroke or mini- stroke. No recent episodes of slurred speech, or temporary blindness.  Cardiac: No recent episodes of chest pain/pressure, no shortness of breath at rest.  +shortness of breath with exertion.  Denies history of atrial fibrillation or irregular heartbeat  Vascular: No history of rest pain in feet.  +history of claudication. + history of non-healing ulcer,+ history of DVT    Pulmonary: No home oxygen, no productive cough, no hemoptysis,  No asthma or wheezing  Musculoskeletal:   Arthritis,  Low back pain,   Joint pain  Hematologic:No history of hypercoagulable state.  No history of easy bleeding.  No history of anemia  Gastrointestinal: No hematochezia or melena,  No gastroesophageal reflux, no trouble swallowing  Urinary:  chronic Kidney disease,  on HD -  MWF or  TTHS,  Burning with urination,  Frequent urination,  Difficulty urinating;    Skin: No rashes  Psychological: No history of anxiety,  No history of depression   Physical Examination    Filed Vitals:   04/30/15 0905 04/30/15 0909  BP: 126/92 142/87  Pulse: 100 66  Temp: 97.5 F (36.4 C)   TempSrc: Oral   Resp: 18   Height:  (1.676 m)   Weight: 158 lb (71.668 kg)    SpO2: 100%     Body mass index is 25.19 kg/(m^2).  General:  Alert and oriented, no acute distress HEENT: Normal Neck: No bruit or JVD Pulmonary: Clear to auscultation bilaterally Cardiac: Regular Rate and Rhythm without murmur Abdomen: Soft, non-tender, non-distended, no mass, no scars Skin: No rash,  3 cm ulcer right lateral malleolus some areas of granulation tissue at the base no surrounding erythema Extremity Pulses:  2+ radial, brachial, absent femoral, dorsalis pedis, posterior tibial pulses bilaterally Musculoskeletal: No deformity or edema     Neurologic: Upper and lower extremity motor 5/5 and symmetric  DATA:   Patient had bilateral ABIs  Several weeks ago which were 0 on the right 0.41 on the left common femoral arteries were occluded bilaterally superficial femoral arteries occluded bilaterally popliteal and tibial vessels occluded in the right leg monophasic flow in the popliteal and tibial vessels left leg   CT angiogram the abdomen and pelvis was reviewed today. Incidental finding of a 7 mm polyp in the transverse colon. Arterial portion the exam shows bilateral external iliac artery occlusions over several centimeters. Bilateral common femoral occlusions with occlusion of the origin of the profunda bilaterally. Bilateral superficial femoral artery occlusions. Reconstitution of the below-knee popliteal artery with three-vessel runoff bilaterally.   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   He is scheduled for a cardiac stress test next week. At the time of his arterial reconstruction we will need to bridge his Coumadin most likely. Smoking cessation was emphasized and high risk of limb loss was also emphasized with the patient today.   I spoke with Dr.  Leone Payor from GI today who thought it would be reasonable to address the patient's aortoiliac occlusive disease first then the patient will need follow-up colonoscopy after he has recovered from his aortobifemoral bypass.  Fabienne Bruns, MD [facility] [place] Office: [phone] Pager: [phone]

## 2015-05-13 NOTE — Progress Notes (Signed)
  [facility] Day of Surgery Note  Subjective:  Patient seen in PACU. Having pain with abdominal and groin incisions.   Filed Vitals:   05/13/15 1600 05/13/15 1615  BP: 138/93 138/84  Pulse: 63 63  Temp:    Resp: 8 12  Tmax 97.9 02 100% 3L  Resting in bed in NAD Midline and groin incisions dressed with minimal drainage.  Abdomen soft, tenderness to palpation. Dopplerable DP and PT signals bilaterally.   Assessment/Plan:  This is a 65 y.o. male who is s/p aortobifemoral bypass  Extubated in OR.  Stable post-op. DP and PT doppler flow bilaterally.  Adequate UOP. To 2S soon.  NG tube, NPO.   Maris Berger, [place] Pager: [phone] 05/13/2015 5:23 PM

## 2015-05-13 NOTE — Anesthesia Postprocedure Evaluation (Signed)
Anesthesia Post Note  Patient: [name]  Procedure(s) Performed: Procedure(s) (LRB): AORTO BIFEMORAL BYPASS GRAFT USING 14 X8 MM X 40 CM HEMASHIELD GRAFT .BILATERAL FEMORAL ARTEERY ENDARTERECTOMY & BILATERAL PROFUNDAPLASTY (Bilateral)  Patient location during evaluation: PACU Anesthesia Type: General Level of consciousness: awake and alert Pain management: pain level controlled Vital Signs Assessment: post-procedure vital signs reviewed and stable Respiratory status: spontaneous breathing, nonlabored ventilation, respiratory function stable and patient connected to nasal cannula oxygen Cardiovascular status: blood pressure returned to baseline and stable Postop Assessment: no signs of nausea or vomiting Anesthetic complications: no    Last Vitals:  Filed Vitals:   05/13/15 1552 05/13/15 1600  BP: 129/80   Pulse: 65 63  Temp: 36.4 C   Resp: 10 8    Last Pain:  Filed Vitals:   05/13/15 1612  PainSc: Asleep                 Shelton Silvas

## 2015-05-13 NOTE — Op Note (Signed)
Procedure: Aortobifemoral bypass, bilateral femoral endarterectomy with profundaplasty  Preoperative diagnosis: Non healing wound right leg  Postoperative diagnosis: Same  Anesthesia: Gen.  Assistant: Gretta Began MD, Karsten Ro PA-C  Operative findings: #1 proximal anastomosis end to side with 14 x 7 mm dacron graft  Operative details: After obtaining informed consent, the patient was taken to the operating room. The patient was placed in supine position on the operating table. After induction of general anesthesia and endotracheal intubation the patient was prepped and draped in usual sterile fashion from the nipples to the toes. Next a longitudinal incision was made in the right groin carried into the subcutaneous tissues down to level the right common femoral artery. The artery was dissected free circumferentially. There was no pulse within the artery and it was completely occluded with plaque. The profunda femoris and superficial femoral arteries were dissected free circumferentially. These were fairly small approximately 3-4 mm in diameter. The common femoral artery was approximately 6 mm in diameter. Several small side branches in the circumflex iliac branches were also dissected free circumferentially and vessel loops placed around these.   A similar incision was made in the left groin. Incision was carried down into the subcutaneous tissues to level left common femoral artery. This also had severe plaque and no pulse within it. This was dissected free circumferentially. Several large side branches were dissected free circumferentially controlled with vessel loops. Superficial femoral and profunda femoris arteries were also dissected free circumferentially and vessel loops placed around these. A vessel loop was also placed around the distal external iliac artery. Retroperitoneal tunnels were started in the groin on both sides with blunt dissection on the dorsal aspect of the artery.  Next a  midline laparotomy incision was made extending from the xiphoid to midway in the umbilicus and pubis. The incision was carried down to the level of the fascia the fascia was incised for the full length of the incision.  Next the omentum was reflected superiorly as well as a transverse colon. The abdomen was inspected.  The colon small bowel and liver were all normal with no obvious pathology.  The small bowel is reflected to the right. The Omni-retractor was brought on to the operative field for assistance in retraction. The retroperitoneum was entered. The inferior mesenteric artery was identified and a vessel loop placed around this. Dissection was carried up on the anterior wall of the aorta up to the level of the left renal vein. Aorta was dissected free on its anterior two thirds surface from this level all the way down to the aortic bifurcation. Several small lumbar arteries were controlled with clips.  Retroperitoneal tunnels were created down the left and right iliac arteries to the groin. This was done a retroureteral retrocolic fashion. An umbilical table placed in the retroperitoneal tunnels.  The patient was given 7000 units of intravenous heparin. After appropriate circulation time, the infrarenal abdominal aorta was clamped just below the inferior mesenteric artery. A vessel loop was used to control the inferior mesenteric artery. An additional aortic clamp was placed just below the renal arteries at the level of the renal vein. A longitudinal opening was made in the aorta just below the renal arteries.  A 14 x 7 mm dacron graft was brought onto the operative field and beveled slightly. This was then sewn end of graft to side of artery using a running 3-0 Prolene suture. This was done end to side fashion to preserve pelvic circulation since the patient has severe  external iliac artery occlusive disease. Just prior to completion of the anastomosis this was thoroughly flushed. Anastomosis was secured,  clamps released, and there was good pulsatile flow the proximal graft. There was good hemostasis. The limbs of the graft were then brought out through the retro-peritoneal tunnels to each groin. Attention was then turned to the left groin. The distal external iliac artery was controlled with a vessel loop. The common femoral artery profunda femoris and superficial femoral arteries were controlled with vessel loops. A longitudinal opening was made in the common femoral artery.  The lumen had been completely obliterated with plaque so I did a femoral endarterectomy extending about 1 cm into the profunda and the SFA was endarterectomized by eversion technique since it was chronically occluded. The was essentially no native common femoral artery flow.  All loose debris was removed and a good profunda endpoint was obtained.  There was some profunda and SFA backbleeding.   The graft was cut to length and beveled and sewn end graft to side of artery using a running 5-0 Prolene suture extending the toe down onto the profunda. Just prior to completion of the anastomosis it was forebled backbled and thoroughly flushed.   The anastomosis was secured, clamps released, and there was good flow into the distal limb immediately. Patient had audible dorsalis pedis and posterior tibial doppler signals. Hemostasis was obtained in the left groin. Attention was then turned to the right groin. In similar fashion the graft was pulled down and cut to length. A longitudinal opening was made in the right common femoral artery.  This also required similar femoral endarterectomy and profundaplasty.  A good distal profunda endpoint was obtained.  There was minimal SFA backbleeding and native external iliac flow.  The graft was beveled and sewn end of graft to side of artery using a running 5-0 Prolene suture. Just prior to completion of the anastomosis, it was forebled backbled and thoroughly flushed.   The anastomosis was secured, vessel loops  released and there was good pulsatile flow in the right common femoral artery immediately. Patient also had audible dorsalis pedis and posterior tibial doppler signals at this point. Hemostasis was obtained the right groin. The abdomen was inspected. The patient had been given an additional [id number] of heparin during the course of the case. The patient's heparin was partially reversed with 70 mg of protamine. Hemostasis had been obtained in all incisions at this point. The retroperitoneum was closed with a running 3-0 Vicryl suture. The viscera were returned to their normal location and the Omni retractor was removed. The sigmoid colon was inspected and found to be pink with no evidence of ischemia. The fascia was then reapproximated using a running #1 PDS suture. The skin was closed with staples. Each groin was then again inspected and thoroughly irrigated. Both were found to be hemostatic. The deep layers the groins were closed in multiple layers or running 2 0 and 3 0 Vicryl suture and a 4 0 Vicryl subcuticular stitch and the skin. The patient tolerated procedure well and there were no complications. Sponge and needle counts were correct at the end of the case. The patient was taken to the recovery room after extubation in stable condition.   Fabienne Bruns, MD [facility] Office: [phone] Pager: [phone]

## 2015-05-13 NOTE — Interval H&P Note (Signed)
History and Physical Interval Note:  05/13/2015 8:34 AM  [name]  has presented today for surgery, with the diagnosis of Peripheral vascular disease with bilateral lower extremity claudication I70.213  The various methods of treatment have been discussed with the patient and family. After consideration of risks, benefits and other options for treatment, the patient has consented to  Procedure(s): AORTOBIFEMORAL BYPASS GRAFT (Bilateral) as a surgical intervention .  The patient's history has been reviewed, patient examined, no change in status, stable for surgery.  I have reviewed the patient's chart and labs.  Questions were answered to the patient's satisfaction.     Fabienne Bruns

## 2015-05-13 NOTE — Progress Notes (Signed)
Patient states that he was prescribed Lovenox after stopping Warfarin.  Patient is unsure of dose of Lovenox but took last dose on Monday.

## 2015-05-13 NOTE — Consult Note (Signed)
PHARMACY CONSULT NOTE  Consult  :  Post Procedure Dosing of Zinacef Indication :  Empiric Post-Procedure Antibiotic Prophylaxis following aortobifemoral bypass.   Pharmacy consulted for any renal adjustments for post-op dosing of Zinacef..    Patient is to receive Zinacef 1.5 gm IV q 12 hours x 2.  Wt 72 kg,  CrCl 85 ml/min.  PLAN:  1. Continue Zinacef 1.5g IV q12h x 2 doses ordered - dosing adjustments are not required. 2. Pharmacy will sign off. Please reconsult if additional assistance is needed.   Thank you for allowing Pharmacy to participate in this patient's care.   Pamala Hayman, Colin Benton,  Pharm.D.,  05/13/2015,  6:12 PM

## 2015-05-13 NOTE — Transfer of Care (Signed)
Immediate Anesthesia Transfer of Care Note  Patient: [name]  Procedure(s) Performed: Procedure(s): AORTO BIFEMORAL BYPASS GRAFT USING 14 X8 MM X 40 CM HEMASHIELD GRAFT .BILATERAL FEMORAL ENDARTERECTOMY & BILATERAL PROFUNDAPLASTY (Bilateral)  Patient Location: PACU  Anesthesia Type:General  Level of Consciousness: awake, alert  and oriented  Airway & Oxygen Therapy: Patient connected to nasal cannula oxygen  Post-op Assessment: Post -op Vital signs reviewed and stable  Post vital signs: stable  Last Vitals:  Filed Vitals:   05/13/15 0654 05/13/15 1552  BP: 166/91 129/80  Pulse: 80 65  Temp: 36.6 C 36.4 C  Resp: 18 10    Complications: No apparent anesthesia complications

## 2015-05-14 ENCOUNTER — Inpatient Hospital Stay (HOSPITAL_COMMUNITY): Payer: Medicare Other

## 2015-05-14 ENCOUNTER — Encounter (HOSPITAL_COMMUNITY): Payer: Self-pay | Admitting: Vascular Surgery

## 2015-05-14 DIAGNOSIS — L8990 Pressure ulcer of unspecified site, unspecified stage: Secondary | ICD-10-CM | POA: Insufficient documentation

## 2015-05-14 LAB — COMPREHENSIVE METABOLIC PANEL
ALBUMIN: 3.2 g/dL — AB (ref 3.5–5.0)
ALT: 49 U/L (ref 17–63)
ANION GAP: 5 (ref 5–15)
AST: 46 U/L — ABNORMAL HIGH (ref 15–41)
Alkaline Phosphatase: 52 U/L (ref 38–126)
BILIRUBIN TOTAL: 0.9 mg/dL (ref 0.3–1.2)
BUN: 7 mg/dL (ref 6–20)
CO2: 26 mmol/L (ref 22–32)
Calcium: 8.5 mg/dL — ABNORMAL LOW (ref 8.9–10.3)
Chloride: 109 mmol/L (ref 101–111)
Creatinine, Ser: 0.74 mg/dL (ref 0.61–1.24)
GFR calc Af Amer: >60 mL/min (ref >60)
GFR calc non Af Amer: >60 mL/min (ref >60)
GLUCOSE: 122 mg/dL — AB (ref 65–99)
POTASSIUM: 3.9 mmol/L (ref 3.5–5.1)
SODIUM: 140 mmol/L (ref 135–145)
TOTAL PROTEIN: 5.8 g/dL — AB (ref 6.5–8.1)

## 2015-05-14 LAB — CBC
HCT: 36.7 % — ABNORMAL LOW (ref 39.0–52.0)
Hemoglobin: 12.2 g/dL — ABNORMAL LOW (ref 13.0–17.0)
MCH: 30.5 pg (ref 26.0–34.0)
MCHC: 33.2 g/dL (ref 30.0–36.0)
MCV: 91.8 fL (ref 78.0–100.0)
PLATELETS: 99 10*3/uL — AB (ref 150–400)
RBC: 4 MIL/uL — ABNORMAL LOW (ref 4.22–5.81)
RDW: 13.2 % (ref 11.5–15.5)
WBC: 10.3 10*3/uL (ref 4.0–10.5)

## 2015-05-14 LAB — MAGNESIUM: MAGNESIUM: 1.9 mg/dL (ref 1.7–2.4)

## 2015-05-14 LAB — AMYLASE: Amylase: 49 U/L (ref 28–100)

## 2015-05-14 MED ORDER — DIPHENHYDRAMINE HCL 12.5 MG/5ML PO ELIX: 12.5000 mg | ORAL_SOLUTION | Freq: Four times a day (QID) | ORAL | Status: DC | PRN

## 2015-05-14 MED ORDER — ENOXAPARIN SODIUM 40 MG/0.4ML ~~LOC~~ SOLN
40.0000 mg | Freq: Every day | SUBCUTANEOUS | Status: DC
  Administered 2015-05-14 – 2015-05-19 (×6): 40 mg via SUBCUTANEOUS
  Filled 2015-05-14 (×6): qty 0.4

## 2015-05-14 MED ORDER — CETYLPYRIDINIUM CHLORIDE 0.05 % MT LIQD
7.0000 mL | Freq: Two times a day (BID) | OROMUCOSAL | Status: DC
  Administered 2015-05-14 – 2015-05-18 (×5): 7 mL via OROMUCOSAL

## 2015-05-14 MED ORDER — SODIUM CHLORIDE 0.9 % IJ SOLN: 9.0000 mL | INTRAMUSCULAR | Status: DC | PRN

## 2015-05-14 MED ORDER — MORPHINE SULFATE 2 MG/ML IV SOLN
INTRAVENOUS | Status: DC
  Administered 2015-05-14: 2 mg via INTRAVENOUS
  Administered 2015-05-14: 12 mg via INTRAVENOUS
  Administered 2015-05-14: 12:00:00 via INTRAVENOUS
  Administered 2015-05-15: 1 mg via INTRAVENOUS
  Administered 2015-05-15: 12:00:00 via INTRAVENOUS
  Administered 2015-05-15: 3 mg via INTRAVENOUS
  Administered 2015-05-15: 6 mg via INTRAVENOUS
  Administered 2015-05-15: 2 mg via INTRAVENOUS
  Administered 2015-05-16: 3 mg via INTRAVENOUS
  Administered 2015-05-16: 06:00:00 via INTRAVENOUS
  Administered 2015-05-16 – 2015-05-17 (×2): 4 mg via INTRAVENOUS
  Administered 2015-05-17: 1 mg via INTRAVENOUS
  Filled 2015-05-14 (×3): qty 25

## 2015-05-14 MED ORDER — DIPHENHYDRAMINE HCL 50 MG/ML IJ SOLN: 12.5000 mg | Freq: Four times a day (QID) | INTRAMUSCULAR | Status: DC | PRN

## 2015-05-14 MED ORDER — CHLORHEXIDINE GLUCONATE 0.12 % MT SOLN
15.0000 mL | Freq: Two times a day (BID) | OROMUCOSAL | Status: DC
  Administered 2015-05-14 – 2015-05-18 (×8): 15 mL via OROMUCOSAL
  Filled 2015-05-14 (×9): qty 15

## 2015-05-14 MED ORDER — NALOXONE HCL 0.4 MG/ML IJ SOLN: 0.4000 mg | INTRAMUSCULAR | Status: DC | PRN

## 2015-05-14 MED FILL — Heparin Sodium (Porcine) Inj 1000 Unit/ML: INTRAMUSCULAR | Qty: 30 | Status: AC

## 2015-05-14 MED FILL — Sodium Chloride IV Soln 0.9%: INTRAVENOUS | Qty: 2000 | Status: AC

## 2015-05-14 NOTE — Progress Notes (Signed)
Utilization Review Completed.  

## 2015-05-14 NOTE — Evaluation (Signed)
Physical Therapy Evaluation Patient Details Name: [name] MRN: [id number] DOB: 02-01-1951 Today's Date: 05/14/2015   History of Present Illness  pt is a 65 y/o male with h/o alcoholic pancreatitis, HTN, admitted with complaints of R foot non healing wound and claudication with short distance mobility, s/p aortobifemoral bypass with bilateral femoral endarterectomies.  Clinical Impression  Pt admitted with/for aortobifemoral BPG/endarterectomies.  Pt currently limited functionally due to the problems listed below.  (see problems list.)  Pt will benefit from PT to maximize function and safety to be able to get home safely with available assist of family.     Follow Up Recommendations No PT follow up    Equipment Recommendations  Rolling walker with 5" wheels    Recommendations for Other Services       Precautions / Restrictions Precautions Precautions: Fall      Mobility  Bed Mobility               General bed mobility comments: OOB in chair on arrival  Transfers Overall transfer level: Needs assistance   Transfers: Sit to/from Stand Sit to Stand: Min assist         General transfer comment: cues for hand placement, some assist to power up.  Ambulation/Gait Ambulation/Gait assistance: Min guard Ambulation Distance (Feet): 130 Feet Assistive device: Rolling walker (2 wheeled)   Gait velocity: slower Gait velocity interpretation: at or above normal speed for age/gender General Gait Details: postural cues and cues for sequencing.  Gait antalgic enough for guarding.  Stairs            Wheelchair Mobility    Modified Rankin (Stroke Patients Only)       Balance Overall balance assessment: Needs assistance   Sitting balance-Leahy Scale: Fair       Standing balance-Leahy Scale: Poor Standing balance comment: reliant on the RW                             Pertinent Vitals/Pain Pain Assessment: Faces Faces Pain Scale: Hurts even  more Pain Location: R> L LE Pain Descriptors / Indicators: Aching;Burning;Sore Pain Intervention(s): Premedicated before session    Home Living Family/patient expects to be discharged to:: Private residence Living Arrangements: Spouse/significant other (other adult) Available Help at Discharge: Family;Available 24 hours/day Type of Home: House Home Access: Stairs to enter Entrance Stairs-Rails: Doctor, general practice of Steps: 3 Home Layout: Two level;Able to live on main level with bedroom/bathroom Home Equipment: Bedside commode      Prior Function Level of Independence: Independent               Hand Dominance        Extremity/Trunk Assessment   Upper Extremity Assessment: Defer to OT evaluation           Lower Extremity Assessment: Generalized weakness;Overall [facility] for tasks assessed      Cervical / Trunk Assessment: Kyphotic  Communication   Communication: No difficulties  Cognition Arousal/Alertness: Awake/alert Behavior During Therapy: WFL for tasks assessed/performed Overall Cognitive Status: Within Functional Limits for tasks assessed                      General Comments      Exercises        Assessment/Plan    PT Assessment Patient needs continued PT services  PT Diagnosis Difficulty walking;Acute pain;Generalized weakness   PT Problem List Decreased strength;Decreased activity tolerance;Decreased balance;Decreased mobility;Pain  PT  Treatment Interventions DME instruction;Gait training;Stair training;Functional mobility training;Therapeutic activities;Balance training;Patient/family education   PT Goals (Current goals can be found in the Care Plan section) Acute Rehab PT Goals Patient Stated Goal: home independent PT Goal Formulation: With patient Time For Goal Achievement: 05/21/15 Potential to Achieve Goals: Good    Frequency Min 3X/week   Barriers to discharge        Co-evaluation               End  of Session Equipment Utilized During Treatment: Oxygen Activity Tolerance: Patient tolerated treatment well Patient left: in chair;with call bell/phone within reach;with family/visitor present Nurse Communication: Mobility status         Time: 1235-1300 PT Time Calculation (min) (ACUTE ONLY): 25 min   Charges:   PT Evaluation $PT Eval Moderate Complexity: 1 Procedure PT Treatments $Gait Training: 8-22 mins   PT G Codes:        [place] Held, Eliseo Gum 05/14/2015, 1:19 PM 05/14/2015  [place] Bing, PT [phone]  (pager)

## 2015-05-14 NOTE — Progress Notes (Signed)
[facility] Progress Note  Subjective  - POD #1  Denies nausea. Belly is sore. Not passing flatus.   Objective Filed Vitals:   05/14/15 0600 05/14/15 0700  BP: 131/74 129/72  Pulse: 71 74  Temp:    Resp: 19 18    Intake/Output Summary (Last 24 hours) at 05/14/15 0737 Last data filed at 05/14/15 0700  Gross per 24 hour  Intake   6230 ml  Output   3870 ml  Net   2360 ml   General: alert and oriented, resting in bed in NAD Pulm: clear to auscultation bilaterally CV: RRR Abdomen: No bowel sounds, soft, guarding, mild diffuse tenderness to palpation, midline incision dressed, minimal drainage. NG with red/brown output.  Extremities: Faintly palpable left DP pulse. Audible DP and PT doppler signals bilaterally.  Neuro: no focal deficits  Assessment/Planning: 65 y.o. male is s/p: aortobifemoral bypass, bilateral femoral endarterectomy with profundaplasty 1 Day Post-Op   Pulm: atelectasis on CXR, improved from yesterday. Encourage incentive spirometry.  CV: prn IV antihypertensives; history of DVT and PE, coumadin held. No signs or symptoms of bleeding. Start Lovenox 40 mg daily for DVT prophylaxis. Plan to restart anticoagulation tomorrow. D/c a-line.  GI: NG tube with 150 cc red brown output last shift. Continue NPO and NG tube until bowel function returns. Stress ulcer prophylaxis.  Heme: Acute blood loss anemia, Hgb stable.  Renal: Scr stable. Good UOP.  FEN: maintenance fluids, keep NPO.   Hydrogel daily to right lateral malleolar wound.  OOB into chair, mobilize as tolerated. Per RN, pain was not well controlled last night. Will start reduced dose PCA.  Transfer to 2W.   [name] 05/14/2015 7:37 AM --   Agree with above.  Pt with good flow to feet.  Dressings dry.  Mobilize some today. Most likely start full dose heparin tomorrow then bridge to his home anticoagulation when taking PO  Martin Bruns, MD [facility]  [place] Office: [phone] Pager: [phone]  Laboratory CBC    Component Value Date/Time   WBC 10.3 05/14/2015 0405   HGB 12.2* 05/14/2015 0405   HCT 36.7* 05/14/2015 0405   PLT 99* 05/14/2015 0405    BMET    Component Value Date/Time   NA 140 05/14/2015 0405   K 3.9 05/14/2015 0405   CL 109 05/14/2015 0405   CO2 26 05/14/2015 0405   GLUCOSE 122* 05/14/2015 0405   BUN 7 05/14/2015 0405   CREATININE 0.74 05/14/2015 0405   CREATININE 0.75 04/22/2015 1121   CALCIUM 8.5* 05/14/2015 0405   GFRNONAA >60 05/14/2015 0405   GFRAA >60 05/14/2015 0405    COAG Lab Results  Component Value Date   INR 1.27 05/13/2015   INR 1.15 05/13/2015   INR 2.11* 05/08/2015   No results found for: PTT  Antibiotics Anti-infectives    Start     Dose/Rate Route Frequency Ordered Stop   05/14/15 0100  cefUROXime (ZINACEF) 1.5 g in dextrose 5 % 50 mL IVPB     1.5 g 100 mL/hr over 30 Minutes Intravenous Every 12 hours 05/13/15 1759 05/15/15 0059   05/13/15 0636  cefUROXime (ZINACEF) 1.5 g in dextrose 5 % 50 mL IVPB     1.5 g 100 mL/hr over 30 Minutes Intravenous 30 min pre-op 05/13/15 0636 05/13/15 1305   05/13/15 0521  dextrose 5 % with cefUROXime (ZINACEF) ADS Med    Comments:  Martin Reasoner   : cabinet override  05/13/15 0521 05/13/15 1729       Martin Berger, PA-C [facility] Office: [phone] Pager: [phone] 05/14/2015 7:37 AM

## 2015-05-14 NOTE — Evaluation (Signed)
Occupational Therapy Evaluation Patient Details Name: [name] MRN: [id number] DOB: Feb 21, 1951 Today's Date: 05/14/2015    History of Present Illness pt is a 65 y/o male with h/o alcoholic pancreatitis, HTN, admitted with complaints of R foot non healing wound and claudication with short distance mobility, s/p aortobifemoral bypass with bilateral femoral endarterectomies.   Clinical Impression   Pt was independent prior to admission.  Requiring 2 person assist for sit to stand, ambulation and transfer to bed due to increased pain this afternoon. Pt demonstrating fair sitting balance at EOB, but poor standing balance with dependence on B UEs for support. Max assist for all LB ADL. Pt should progress well once pain decreases. He has good family support.  Follow Up Recommendations  No OT follow up    Equipment Recommendations       Recommendations for Other Services       Precautions / Restrictions Precautions Precautions: Fall Precaution Comments: NG tube      Mobility Bed Mobility Overal bed mobility: Needs Assistance Bed Mobility: Sit to Sidelying;Rolling Rolling: Min assist       Sit to sidelying: Mod assist General bed mobility comments: instructed in log roll technique to minimize pain  Transfers Overall transfer level: Needs assistance Equipment used: Rolling walker (2 wheeled) Transfers: Sit to/from Stand Sit to Stand: +2 physical assistance;Min assist         General transfer comment: cues for hand placement, to scoot edge of chair and to rise, good control of descent    Balance Overall balance assessment: Needs assistance   Sitting balance-Leahy Scale: Fair       Standing balance-Leahy Scale: Poor Standing balance comment: reliant on the RW                            ADL Overall ADL's : Needs assistance/impaired Eating/Feeding: NPO   Grooming: Wash/dry hands;Set up;Sitting   Upper Body Bathing: Moderate assistance;Sitting    Lower Body Bathing: Maximal assistance;Sit to/from stand   Upper Body Dressing : Minimal assistance;Sitting   Lower Body Dressing: Maximal assistance;Sit to/from stand   Toilet Transfer: +2 for physical assistance;Moderate assistance;Stand-pivot   Toileting- Clothing Manipulation and Hygiene: Sit to/from stand;Minimal assistance       Functional mobility during ADLs: +2 for physical assistance;Minimal assistance       Vision     Perception     Praxis      Pertinent Vitals/Pain Pain Assessment: Faces Faces Pain Scale: Hurts whole lot Pain Location: incisional pain Pain Descriptors / Indicators: Grimacing;Guarding;Aching Pain Intervention(s): Limited activity within patient's tolerance;Monitored during session;Repositioned;PCA encouraged     Hand Dominance Right   Extremity/Trunk Assessment Upper Extremity Assessment Upper Extremity Assessment: RUE deficits/detail RUE Deficits / Details: nerve damage prevents extension of fingers/wrist RUE Coordination: decreased fine motor   Lower Extremity Assessment Lower Extremity Assessment: Defer to PT evaluation   Cervical / Trunk Assessment Cervical / Trunk Assessment: Kyphotic   Communication Communication Communication: No difficulties   Cognition Arousal/Alertness: Awake/alert Behavior During Therapy: WFL for tasks assessed/performed Overall Cognitive Status: Within Functional Limits for tasks assessed                     General Comments       Exercises       Shoulder Instructions      Home Living Family/patient expects to be discharged to:: Private residence Living Arrangements: Spouse/significant other Available Help at Discharge: Family;Available 24  hours/day Type of Home: House Home Access: Stairs to enter [employer] of Steps: 3 Entrance Stairs-Rails: Right;Left Home Layout: Two level;Able to live on main level with bedroom/bathroom Alternate Level Stairs-Number of Steps:  flight Alternate Level Stairs-Rails: Right;Left Bathroom Shower/Tub: Producer, television/film/video: Standard     Home Equipment: Bedside commode;Shower seat   Additional Comments: wife reports shower seat is not safe      Prior Functioning/Environment Level of Independence: Independent             OT Diagnosis: Generalized weakness;Acute pain   OT Problem List: Decreased strength;Decreased activity tolerance;Impaired balance (sitting and/or standing);Decreased range of motion;Decreased coordination;Decreased knowledge of use of DME or AE;Pain;Impaired UE functional use   OT Treatment/Interventions: Self-care/ADL training;DME and/or AE instruction;Therapeutic activities;Patient/family education;Balance training    OT Goals(Current goals can be found in the care plan section) Acute Rehab OT Goals Patient Stated Goal: home independent OT Goal Formulation: With patient Time For Goal Achievement: 05/28/15 Potential to Achieve Goals: Good ADL Goals Pt Will Perform Grooming: with modified independence;standing Pt Will Perform Lower Body Bathing: with caregiver independent in assisting;with min assist;sit to/from stand Pt Will Perform Lower Body Dressing: with min assist;with caregiver independent in assisting;sit to/from stand Pt Will Transfer to Toilet: with modified independence;ambulating;bedside commode (over toilet) Pt Will Perform Toileting - Clothing Manipulation and hygiene: with modified independence;sit to/from stand Pt Will Perform Tub/Shower Transfer: Shower transfer;with supervision;ambulating;3 in 1;rolling walker Additional ADL Goal #1: Pt will perform bed mobility at a modified independent level.  OT Frequency: Min 2X/week   Barriers to D/C:            Co-evaluation              End of Session Equipment Utilized During Treatment: Gait belt;Rolling walker;Oxygen Nurse Communication: Mobility status  Activity Tolerance: Patient limited by  pain Patient left: in bed;with call bell/phone within reach;with nursing/sitter in room   Time: 1345-1408 OT Time Calculation (min): 23 min Charges:  OT General Charges $OT Visit: 1 Procedure OT Evaluation $OT Eval Moderate Complexity: 1 Procedure OT Treatments $Self Care/Home Management : 8-22 mins G-Codes:    Evern Bio 05/14/2015, 2:45 PM  [phone]

## 2015-05-15 LAB — TYPE AND SCREEN
ABO/RH(D): B POS
ANTIBODY SCREEN: NEGATIVE
UNIT DIVISION: 0
Unit division: 0

## 2015-05-15 LAB — PROTIME-INR
INR: 1.38 (ref 0.00–1.49)
Prothrombin Time: 17.1 seconds — ABNORMAL HIGH (ref 11.6–15.2)

## 2015-05-15 MED ORDER — KCL IN DEXTROSE-NACL 20-5-0.45 MEQ/L-%-% IV SOLN
INTRAVENOUS | Status: DC
  Administered 2015-05-15 (×2): via INTRAVENOUS
  Administered 2015-05-16: 75 mL via INTRAVENOUS
  Administered 2015-05-17 – 2015-05-18 (×2): via INTRAVENOUS
  Filled 2015-05-15 (×4): qty 1000

## 2015-05-15 MED ORDER — SODIUM CHLORIDE 0.9 % IJ SOLN: 10.0000 mL | INTRAMUSCULAR | Status: DC | PRN

## 2015-05-15 MED ORDER — SODIUM CHLORIDE 0.9 % IJ SOLN
10.0000 mL | Freq: Two times a day (BID) | INTRAMUSCULAR | Status: DC
  Administered 2015-05-15 – 2015-05-17 (×4): 10 mL

## 2015-05-15 NOTE — Care Management Important Message (Signed)
Important Message  Patient Details  Name: [name] MRN: [id number] Date of Birth: 1950-10-13   Medicare Important Message Given:  Yes    [name] 05/15/2015, 3:08 PM

## 2015-05-15 NOTE — Progress Notes (Signed)
Physical Therapy Treatment Patient Details Name: [name] MRN: [id number] DOB: 1950-09-24 Today's Date: 05/15/2015    History of Present Illness pt is a 65 y/o male with h/o alcoholic pancreatitis, HTN, admitted with complaints of R foot non healing wound and claudication with short distance mobility, s/p aortobifemoral bypass with bilateral femoral endarterectomies.    PT Comments    Progressing slowly.  Pt clearly uncomfortable, having trouble managing thick secretions.  Time spent with suction and cloth getting out the secretions.  Mobility progressing as expected.  Follow Up Recommendations  Home health PT     Equipment Recommendations  Rolling walker with 5" wheels    Recommendations for Other Services       Precautions / Restrictions Precautions Precautions: Fall Precaution Comments: NG tube Restrictions Weight Bearing Restrictions: Yes    Mobility  Bed Mobility Overal bed mobility: Needs Assistance                Transfers Overall transfer level: Needs assistance Equipment used: Rolling walker (2 wheeled) Transfers: Sit to/from Stand Sit to Stand: Min assist;+2 safety/equipment         General transfer comment: cues for hand placement, to scoot edge of chair and to rise, good control of descent  Ambulation/Gait Ambulation/Gait assistance: Min assist;Min guard Ambulation Distance (Feet): 120 Feet Assistive device: Rolling walker (2 wheeled) Gait Pattern/deviations: Step-through pattern Gait velocity: slower   General Gait Details: postural cues and cues for better use of the RW.  Pt very distracted with having trouble coughing up thick mucus ball,.   Stairs            Wheelchair Mobility    Modified Rankin (Stroke Patients Only)       Balance     Sitting balance-Leahy Scale: Fair       Standing balance-Leahy Scale: Poor Standing balance comment: reliant on the RW                    Cognition Arousal/Alertness:  Awake/alert Behavior During Therapy: WFL for tasks assessed/performed Overall Cognitive Status: Within Functional Limits for tasks assessed                      Exercises      General Comments        Pertinent Vitals/Pain Pain Assessment: Faces Faces Pain Scale: Hurts whole lot Pain Location: R>L LE  lower abdomen Pain Descriptors / Indicators: Burning;Sore Pain Intervention(s): Monitored during session    Home Living                      Prior Function            PT Goals (current goals can now be found in the care plan section) Acute Rehab PT Goals Patient Stated Goal: home independent PT Goal Formulation: With patient Time For Goal Achievement: 05/21/15 Potential to Achieve Goals: Good Progress towards PT goals: Progressing toward goals    Frequency  Min 3X/week    PT Plan Current plan remains appropriate    Co-evaluation             End of Session Equipment Utilized During Treatment: Oxygen Activity Tolerance: Patient tolerated treatment well Patient left: in chair;with call bell/phone within reach;with family/visitor present     Time: 7829-5621 PT Time Calculation (min) (ACUTE ONLY): 31 min  Charges:  $Gait Training: 8-22 mins $Therapeutic Activity: 8-22 mins  G Codes:      Jaycob Mcclenton, Eliseo Gum 05/15/2015, 12:22 PM 05/15/2015  [place] Bing, PT [phone]  (pager)

## 2015-05-15 NOTE — Progress Notes (Addendum)
[facility] [place]  Subjective  - still with pain in abdomen   Objective 132/62 80 98.7 F (37.1 C) (Oral) 18 100%  Intake/Output Summary (Last 24 hours) at 05/15/15 0847 Last data filed at 05/15/15 1610  Gross per 24 hour  Intake    500 ml  Output   1190 ml  Net   -690 ml   NG tube still dark green bilious 300 ml last 8 hours No flatus Abdomen groin incisions clean Feet warm  Assessment/Planning: D/c central line Continue NG for now Decrease IV fluid to 75/hr Ambulate Continue SCDs and prophylaxis dose lovenox for now Will restart coumadin when taking PO  Fabienne Bruns 05/15/2015 8:47 AM --  Laboratory Lab Results:  Recent Labs  05/13/15 1700 05/14/15 0405  WBC 10.7* 10.3  HGB 11.1* 12.2*  HCT 33.8* 36.7*  PLT 89* 99*   BMET  Recent Labs  05/13/15 1700 05/14/15 0405  NA 135 140  K 3.9 3.9  CL 104 109  CO2 22 26  GLUCOSE 162* 122*  BUN 9 7  CREATININE 0.65 0.74  CALCIUM 8.3* 8.5*    COAG Lab Results  Component Value Date   INR 1.27 05/13/2015   INR 1.15 05/13/2015   INR 2.11* 05/08/2015   No results found for: PTT

## 2015-05-15 NOTE — Care Management Note (Addendum)
Case Management Note  Patient Details  Name: [name] MRN: [id number] Date of Birth: [birth year]  Subjective/Objective:    Pt is admitted with aortoilac occlusive disease           Action/Plan:  Pt is from home with spouse.  CM will continue to monitor for discharge needs   Expected Discharge Date:  05/19/15               Expected Discharge Plan:  Home/Self Care  In-House Referral:     Discharge planning Services  CM Consult  Post Acute Care Choice:    Choice offered to:     DME Arranged:    DME Agency:     HH Arranged:    HH Agency:     Status of Service:  In process, will continue to follow  Medicare Important Message Given:    Date Medicare IM Given:    Medicare IM give by:    Date Additional Medicare IM Given:    Additional Medicare Important Message give by:     If discussed at Long Length of Stay Meetings, dates discussed:    Additional Comments: Pt has NG tube and on morphine PCA  In-house therapy have documented their HH/DME recommendations - CM requested orders via progression meeting and via physician sticky note -  CM will arrange for services once orders are received Cherylann Parr, RN 05/15/2015, 11:33 AM

## 2015-05-16 LAB — BASIC METABOLIC PANEL
ANION GAP: 7 (ref 5–15)
BUN: 7 mg/dL (ref 6–20)
CALCIUM: 8.6 mg/dL — AB (ref 8.9–10.3)
CO2: 23 mmol/L (ref 22–32)
CREATININE: 0.74 mg/dL (ref 0.61–1.24)
Chloride: 105 mmol/L (ref 101–111)
GFR calc Af Amer: >60 mL/min (ref >60)
GLUCOSE: 111 mg/dL — AB (ref 65–99)
Potassium: 4 mmol/L (ref 3.5–5.1)
Sodium: 135 mmol/L (ref 135–145)

## 2015-05-16 LAB — CBC
HCT: 40.2 % (ref 39.0–52.0)
Hemoglobin: 13.6 g/dL (ref 13.0–17.0)
MCH: 31.5 pg (ref 26.0–34.0)
MCHC: 33.8 g/dL (ref 30.0–36.0)
MCV: 93.1 fL (ref 78.0–100.0)
PLATELETS: 84 10*3/uL — AB (ref 150–400)
RBC: 4.32 MIL/uL (ref 4.22–5.81)
RDW: 13.2 % (ref 11.5–15.5)
WBC: 15.5 10*3/uL — AB (ref 4.0–10.5)

## 2015-05-16 MED ORDER — BISACODYL 10 MG RE SUPP: 10.0000 mg | Freq: Every day | RECTAL | Status: DC | PRN

## 2015-05-16 MED ORDER — KETOROLAC TROMETHAMINE 15 MG/ML IJ SOLN
15.0000 mg | Freq: Four times a day (QID) | INTRAMUSCULAR | Status: DC
  Administered 2015-05-16 – 2015-05-18 (×9): 15 mg via INTRAVENOUS
  Filled 2015-05-16 (×9): qty 1

## 2015-05-16 NOTE — Progress Notes (Addendum)
  [facility] Progress Note  Subjective  - POD #3  Still uncomfortable. Not passing flatus.   Objective Filed Vitals:   05/16/15 0400 05/16/15 0552  BP:    Pulse:    Temp:    Resp: 20 19    Intake/Output Summary (Last 24 hours) at 05/16/15 0848 Last data filed at 05/16/15 4098  Gross per 24 hour  Intake    900 ml  Output    855 ml  Net     45 ml   Appears uncomfortable. Doesn't speak much. In NAD.  Abdominal and groin incisions healing well.  NG with close to 400 cc bilious output.   Assessment/Planning: 65 y.o. male is s/p: Aortobifemoral bypass, bilateral femoral endarterectomy with profundaplasty 3 Days Post-Op   -Not passing flatus. Per RN, not sure what total output from NG was yesterday. Almost 400 cc in canister. Will keep NG in today. Will need NG output accurately documented today.  -Keep NPO.  -D/c foley and mobilize. -Will try toradol for pain. Keep PCA.  Raymond Gurney 05/16/2015 8:48 AM --  Laboratory CBC    Component Value Date/Time   WBC 15.5* 05/16/2015 0350   HGB 13.6 05/16/2015 0350   HCT 40.2 05/16/2015 0350   PLT 84* 05/16/2015 0350    BMET    Component Value Date/Time   NA 135 05/16/2015 0350   K 4.0 05/16/2015 0350   CL 105 05/16/2015 0350   CO2 23 05/16/2015 0350   GLUCOSE 111* 05/16/2015 0350   BUN 7 05/16/2015 0350   CREATININE 0.74 05/16/2015 0350   CREATININE 0.75 04/22/2015 1121   CALCIUM 8.6* 05/16/2015 0350   GFRNONAA >60 05/16/2015 0350   GFRAA >60 05/16/2015 0350    COAG Lab Results  Component Value Date   INR 1.38 05/15/2015   INR 1.27 05/13/2015   INR 1.15 05/13/2015   No results found for: PTT  Antibiotics Anti-infectives    Start     Dose/Rate Route Frequency Ordered Stop   05/14/15 0100  cefUROXime (ZINACEF) 1.5 g in dextrose 5 % 50 mL IVPB     1.5 g 100 mL/hr over 30 Minutes Intravenous Every 12 hours 05/13/15 1759 05/14/15 1619   05/13/15 0636  cefUROXime (ZINACEF) 1.5 g in  dextrose 5 % 50 mL IVPB     1.5 g 100 mL/hr over 30 Minutes Intravenous 30 min pre-op 05/13/15 0636 05/13/15 1305   05/13/15 0521  dextrose 5 % with cefUROXime (ZINACEF) ADS Med    Comments:  Leandro Reasoner   : cabinet override      05/13/15 0521 05/13/15 1729       Maris Berger, PA-C [facility] Office: [phone] Pager: [phone] 05/16/2015 8:48 AM     I agree with the above.  The patient looks uncomfortable and is complaining of pain.  He has not passed any flatus. GU: We'll remove his Foley catheter GI: Confirmed NG tube output.  Considered discontinuing today or tomorrow Pain: We'll start Toradol and keep PCA Continue to mobilize and ambulate  Wells Brabham

## 2015-05-17 LAB — CBC
HEMATOCRIT: 36.5 % — AB (ref 39.0–52.0)
Hemoglobin: 12.5 g/dL — ABNORMAL LOW (ref 13.0–17.0)
MCH: 31.6 pg (ref 26.0–34.0)
MCHC: 34.2 g/dL (ref 30.0–36.0)
MCV: 92.4 fL (ref 78.0–100.0)
Platelets: 95 10*3/uL — ABNORMAL LOW (ref 150–400)
RBC: 3.95 MIL/uL — ABNORMAL LOW (ref 4.22–5.81)
RDW: 13.1 % (ref 11.5–15.5)
WBC: 10.4 10*3/uL (ref 4.0–10.5)

## 2015-05-17 LAB — BASIC METABOLIC PANEL
Anion gap: 7 (ref 5–15)
BUN: 9 mg/dL (ref 6–20)
CALCIUM: 8.3 mg/dL — AB (ref 8.9–10.3)
CO2: 25 mmol/L (ref 22–32)
CREATININE: 0.81 mg/dL (ref 0.61–1.24)
Chloride: 105 mmol/L (ref 101–111)
GFR calc Af Amer: >60 mL/min (ref >60)
GFR calc non Af Amer: >60 mL/min (ref >60)
GLUCOSE: 104 mg/dL — AB (ref 65–99)
Potassium: 3.6 mmol/L (ref 3.5–5.1)
Sodium: 137 mmol/L (ref 135–145)

## 2015-05-17 MED ORDER — OXYCODONE-ACETAMINOPHEN 5-325 MG PO TABS
1.0000 | ORAL_TABLET | ORAL | Status: DC | PRN
  Administered 2015-05-17 – 2015-05-18 (×5): 2 via ORAL
  Administered 2015-05-19: 1 via ORAL
  Administered 2015-05-19: 2 via ORAL
  Filled 2015-05-17 (×5): qty 2
  Filled 2015-05-17: qty 1
  Filled 2015-05-17: qty 2

## 2015-05-17 NOTE — Progress Notes (Signed)
Pt has only used 2 mg on his PCA since 4 am. He also has had 320 output on his NG tube from 7am 1/21- 7am 1/22 Teresa Coombs 9:31 AM

## 2015-05-17 NOTE — Progress Notes (Addendum)
  [facility] Progress Note  Subjective  - POD #4  Feels much better today. Denies abdominal pain.  Passing flatus now. Feels hungry. Denies nausea.   Objective Filed Vitals:   05/17/15 0448 05/17/15 0800  BP: 103/69   Pulse: 67   Temp: 98.2 F (36.8 C)   Resp: 18 17    Intake/Output Summary (Last 24 hours) at 05/17/15 1034 Last data filed at 05/17/15 1610  Gross per 24 hour  Intake    115 ml  Output    250 ml  Net   -135 ml    Abdomen soft and non tender.  Incisions healing well.  Feet warm and well perfused. Right lateral malleolar wound healing well.   Assessment/Planning: 65 y.o. male is s/p: aortobifemoral bypass, bilateral femoral endarterectomy with profundaplasty 4 Days Post-Op   Patient appears comfortable today.  Passing flatus. Will d/c NG tube and start clear liquids.  Continue toradol. Creatinine is stable. D/c PCA. Start po pain meds prn.  Hydrogel daily to right lateral ankle wound. Ulcer appears to be healing well.  Mobilize.  Continue lovenox for DVT prophylaxis. If tolerating po's today, will start coumadin back tomorrow.   Raymond Gurney 05/17/2015 10:34 AM --  Laboratory CBC    Component Value Date/Time   WBC 10.4 05/17/2015 0438   HGB 12.5* 05/17/2015 0438   HCT 36.5* 05/17/2015 0438   PLT 95* 05/17/2015 0438    BMET    Component Value Date/Time   NA 137 05/17/2015 0438   K 3.6 05/17/2015 0438   CL 105 05/17/2015 0438   CO2 25 05/17/2015 0438   GLUCOSE 104* 05/17/2015 0438   BUN 9 05/17/2015 0438   CREATININE 0.81 05/17/2015 0438   CREATININE 0.75 04/22/2015 1121   CALCIUM 8.3* 05/17/2015 0438   GFRNONAA >60 05/17/2015 0438   GFRAA >60 05/17/2015 0438    COAG Lab Results  Component Value Date   INR 1.38 05/15/2015   INR 1.27 05/13/2015   INR 1.15 05/13/2015   No results found for: PTT  Antibiotics Anti-infectives    Start     Dose/Rate Route Frequency Ordered Stop   05/14/15 0100  cefUROXime  (ZINACEF) 1.5 g in dextrose 5 % 50 mL IVPB     1.5 g 100 mL/hr over 30 Minutes Intravenous Every 12 hours 05/13/15 1759 05/14/15 1619   05/13/15 0636  cefUROXime (ZINACEF) 1.5 g in dextrose 5 % 50 mL IVPB     1.5 g 100 mL/hr over 30 Minutes Intravenous 30 min pre-op 05/13/15 0636 05/13/15 1305   05/13/15 0521  dextrose 5 % with cefUROXime (ZINACEF) ADS Med    Comments:  Leandro Reasoner   : cabinet override      05/13/15 0521 05/13/15 1729       Maris Berger, PA-C [facility] Office: [phone] Pager: [phone] 05/17/2015 10:34 AM     I agree with the above.  I have seen and evaluated the patient.  He is postoperative day #4, status post aortobifemoral bypass graft.  He feels much better today, likely secondary to initiation of Toradol as his pain is better. GI: Remove his NG tube today and start clear liquid diet GU: He is voiding spontaneously with a condom catheter.  Creatinine remained stable Anticoagulation: Continue Lovenox.  We'll start Coumadin back tomorrow Pain: We'll discontinue PCA and start oral medication Wound: Continue wound care.  Appeared to be improving Continue mobilization   [employer]

## 2015-05-18 LAB — BLOOD GAS, ARTERIAL
Acid-base deficit: 3.6 mmol/L — ABNORMAL HIGH (ref 0.0–2.0)
BICARBONATE: 20.9 meq/L (ref 20.0–24.0)
O2 CONTENT: 3 L/min
O2 Saturation: 92.6 %
PCO2 ART: 37.5 mmHg (ref 35.0–45.0)
PH ART: 7.365 (ref 7.350–7.450)
PO2 ART: 67.4 mmHg — AB (ref 80.0–100.0)
Patient temperature: 98.6
TCO2: 22 mmol/L (ref 0–100)

## 2015-05-18 MED ORDER — WARFARIN - PHARMACIST DOSING INPATIENT
Freq: Every day | Status: DC
  Administered 2015-05-18: 18:00:00

## 2015-05-18 MED ORDER — WARFARIN SODIUM 2 MG PO TABS
12.0000 mg | ORAL_TABLET | Freq: Once | ORAL | Status: AC
  Administered 2015-05-18: 12 mg via ORAL
  Filled 2015-05-18: qty 1

## 2015-05-18 MED ORDER — BISACODYL 10 MG RE SUPP
10.0000 mg | Freq: Once | RECTAL | Status: AC
  Administered 2015-05-18: 10 mg via RECTAL
  Filled 2015-05-18: qty 1

## 2015-05-18 MED ORDER — PANTOPRAZOLE SODIUM 40 MG PO TBEC
40.0000 mg | DELAYED_RELEASE_TABLET | Freq: Every day | ORAL | Status: DC
  Administered 2015-05-18: 40 mg via ORAL
  Filled 2015-05-18: qty 1

## 2015-05-18 NOTE — Progress Notes (Addendum)
ANTICOAGULATION CONSULT NOTE - Initial Consult  Pharmacy Consult for warfarin Indication: hx VTE  Allergies  Allergen Reactions  . Sulfa Antibiotics Itching and Rash    Patient Measurements: Height:  (167.6 cm) Weight: 159 lb 1.6 oz (72.167 kg) IBW/kg (Calculated) : 63.8  Vital Signs: Temp: 98.6 F (37 C) (01/23 0556) Temp Source: Oral (01/23 0556) BP: 110/65 mmHg (01/23 0732) Pulse Rate: 77 (01/23 0556)  Labs:  Recent Labs  05/16/15 0350 05/17/15 0438  HGB 13.6 12.5*  HCT 40.2 36.5*  PLT 84* 95*  CREATININE 0.74 0.81    Estimated Creatinine Clearance: 84.2 mL/min (by C-G formula based on Cr of 0.81).   Medical History: Past Medical History  Diagnosis Date  . Pancreatitis, alcoholic   . Hypertension     pt. denies htn,was treated 10 yrs, ago,not on any meds now  . 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 aspirin 81 MG chewable tablet Chew 81 mg by mouth daily.   05/13/2015 at 0500  . gabapentin (NEURONTIN) 100 MG capsule Take 100 mg by mouth 3 (three) times daily as needed. For pain   05/12/2015 at Unknown time  . traMADol (ULTRAM) 50 MG tablet Take 100 mg by mouth 2 (two) times daily. Pain   05/12/2015 at Unknown time  . warfarin (COUMADIN) 4 MG tablet Take 8 mg by mouth daily.   Past Month at Unknown time  . albuterol (PROVENTIL HFA) 108 (90 Base) MCG/ACT inhaler Inhale 2 puffs into the lungs every 6 (six) hours as needed for wheezing or shortness of breath.   More than a month at Unknown time    Assessment: 66 y/o male s/p aortobifemoral bypass, bilateral femoral endarterectomy with profundaplasty on 1/18. 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 Pharmacy consulted to resume today. INR is subtherapeutic at 1.38 on 1/20. 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 Also on enoxaparin 40  mg/d.  PTA regimen: 8 mg daily  Goal of Therapy:  INR 2-3 Monitor platelets by anticoagulation protocol: Yes   Plan:  - Warfarin 12 mg PO tonight - Discontinue enoxaparin when INR >2 - INR daily - Monitor for s/sx of bleeding  [facility], [address].D., BCPS Clinical Pharmacist Pager: [phone] 05/18/2015 1:11 PM

## 2015-05-18 NOTE — Care Management Important Message (Signed)
Important Message  Patient Details  Name: [name] MRN: [id number] Date of Birth: 05/21/1950   Medicare Important Message Given:  Yes    [name] P [name] 05/18/2015, 2:18 PM

## 2015-05-18 NOTE — Progress Notes (Addendum)
  [facility] Progress Note    05/18/2015 7:25 AM 5 Days Post-Op  Subjective:  Feeling better. Feels his ulcer is getting smaller; states he had pain this morning, but didn't want to bother anyone to get pain medicine.  Passing gas but no BM.  Afebrile HR 70's NSR (141 earlier this am with pain-lopressor given) 100's-110's systolic 94% RA  Filed Vitals:   05/17/15 2055 05/18/15 0556  BP: 104/57 111/66  Pulse: 72 77  Temp: 98.6 F (37 C) 98.6 F (37 C)  Resp: 18 18    Physical Exam: Cardiac:  regular Lungs:  CTAB Abdomen:  Soft, NT/ND; occasional BS Incisions:  Midline incision with staples in tact; bilateral groin incisions are c/d/i Extremities:  Bilateral feet are warm and well perfused; right lateral ankle ulcer with granulation tissue present.  CBC    Component Value Date/Time   WBC 10.4 05/17/2015 0438   RBC 3.95* 05/17/2015 0438   HGB 12.5* 05/17/2015 0438   HCT 36.5* 05/17/2015 0438   PLT 95* 05/17/2015 0438   MCV 92.4 05/17/2015 0438   MCH 31.6 05/17/2015 0438   MCHC 34.2 05/17/2015 0438   RDW 13.1 05/17/2015 0438   LYMPHSABS 2.7 06/26/2009 1446   MONOABS 0.5 06/26/2009 1446   EOSABS 0.1 06/26/2009 1446   BASOSABS 0.0 06/26/2009 1446    BMET    Component Value Date/Time   NA 137 05/17/2015 0438   K 3.6 05/17/2015 0438   CL 105 05/17/2015 0438   CO2 25 05/17/2015 0438   GLUCOSE 104* 05/17/2015 0438   BUN 9 05/17/2015 0438   CREATININE 0.81 05/17/2015 0438   CREATININE 0.75 04/22/2015 1121   CALCIUM 8.3* 05/17/2015 0438   GFRNONAA >60 05/17/2015 0438   GFRAA >60 05/17/2015 0438    INR    Component Value Date/Time   INR 1.38 05/15/2015 0839     Intake/Output Summary (Last 24 hours) at 05/18/15 0725 Last data filed at 05/18/15 0100  Gross per 24 hour  Intake   3255 ml  Output    950 ml  Net   2305 ml     Assessment/Plan:  65 y.o. male is s/p  Aortobifemoral bypass, bilateral femoral endarterectomy with profundaplasty 5 Days Post-Op  -pt  doing well this morning and tolerated clears yesterday.  He is passing flatus, but no BM.  Will give dulcolax supp this morning and advance diet. -continue to stay on top of pain medication -watch HR-140bpm this morning appears to be an isolated incident with pain as he is back in the 70's and sinus this morning. -restart coumadin per pharmacy today   Doreatha Massed, PA-C [facility] [phone] 05/18/2015 7:25 AM  Had BM today.  Tolerating diet.  Pain improving Saline lock IV D/c home tomorrow  Fabienne Bruns, MD [facility] [place] Office: [phone] Pager: [phone]

## 2015-05-18 NOTE — Progress Notes (Signed)
Occupational Therapy Treatment Patient Details Name: [name] MRN: [id number] DOB: 02-07-1951 Today's Date: 05/18/2015    History of present illness pt is a 65 y/o male with h/o alcoholic pancreatitis, HTN, admitted with complaints of R foot non healing wound and claudication with short distance mobility, s/p aortobifemoral bypass with bilateral femoral endarterectomies.   OT comments  Pt making steady progress with ADL and mobility at RW level. Overall min A for LB ADL and S for mobility. Recommend initial 24/7 S after D/C. Will continue to follow to facilitate safe D/C home.   Follow Up Recommendations  No OT follow up;Supervision/Assistance - 24 hour (initially)    Equipment Recommendations  None recommended by OT    Recommendations for Other Services      Precautions / Restrictions Precautions Precautions: Fall Restrictions Weight Bearing Restrictions: Yes       Mobility Bed Mobility Overal bed mobility: Modified Independent             General bed mobility comments: up in chair  Transfers Overall transfer level: Needs assistance Equipment used: Rolling walker (2 wheeled) Transfers: Sit to/from Stand Sit to Stand: Supervision         General transfer comment: vc for hand placement    Balance Overall balance assessment: Needs assistance Sitting-balance support: No upper extremity supported Sitting balance-Leahy Scale: Good     Standing balance support: Single extremity supported;Bilateral upper extremity supported Standing balance-Leahy Scale: Fair Standing balance comment: still somewhat reliant on the RW                   ADL Overall ADL's : Needs assistance/impaired     Grooming: Set up;Standing   Upper Body Bathing: Set up;Standing   Lower Body Bathing: Minimal assistance;Sit to/from stand Lower Body Bathing Details (indicate cue type and reason): for feet Upper Body Dressing : Set up;Standing   Lower Body Dressing: Minimal  assistance;Sit to/from stand       Toileting- Architect and Hygiene: Minimal assistance;Sit to/from stand       Functional mobility during ADLs: Minimal assistance;Rolling walker General ADL Comments: Strong smell of urine on entry to room. Bed was sitting in chair after session with PT. Bed was wet. Pt states he has been "sweating". Fatigues during ADL, but endurance improving. Began discussion of energy conservation during bathing.       Vision                     Perception     Praxis      Cognition   Behavior During Therapy: [facility] for tasks assessed/performed Overall Cognitive Status: Within Functional Limits for tasks assessed  No family available - most likely baseline cognitive function. ? Decreased STM                     Extremity/Trunk Assessment               Exercises     Shoulder Instructions       General Comments  Pt very appreciatie.     Pertinent Vitals/ Pain       Pain Assessment: Faces Faces Pain Scale: Hurts a little bit Pain Location: abdomen Pain Descriptors / Indicators: Sore Pain Intervention(s): Limited activity within patient's tolerance  Home Living  Prior Functioning/Environment              Frequency Min 2X/week     Progress Toward Goals  OT Goals(current goals can now be found in the care plan section)  Progress towards OT goals: Progressing toward goals  Acute Rehab OT Goals Patient Stated Goal: home independent OT Goal Formulation: With patient Time For Goal Achievement: 05/28/15 Potential to Achieve Goals: Good ADL Goals Pt Will Perform Grooming: with modified independence;standing Pt Will Perform Lower Body Bathing: with caregiver independent in assisting;with min assist;sit to/from stand Pt Will Perform Lower Body Dressing: with min assist;with caregiver independent in assisting;sit to/from stand Pt Will Transfer to Toilet:  with modified independence;ambulating;bedside commode Pt Will Perform Toileting - Clothing Manipulation and hygiene: with modified independence;sit to/from stand Pt Will Perform Tub/Shower Transfer: Shower transfer;with supervision;ambulating;3 in 1;rolling walker Additional ADL Goal #1: Pt will perform bed mobility at a modified independent level.  Plan Discharge plan remains appropriate    Co-evaluation                 End of Session Equipment Utilized During Treatment: Rolling walker   Activity Tolerance Patient tolerated treatment well   Patient Left in chair;with call bell/phone within reach;with chair alarm set   Nurse Communication Mobility status        Time: 1610-9604 OT Time Calculation (min): 23 min  Charges: OT General Charges $OT Visit: 1 Procedure OT Treatments $Self Care/Home Management : 23-37 mins  Alyzabeth Pontillo,HILLARY 05/18/2015, 2:48 PM   [facility], OTR/L  [phone] 05/18/2015

## 2015-05-18 NOTE — Progress Notes (Signed)
Prn Lopressor given for sustained elevated HR, pt also in severe pain, pain meds given, comforted patient, encouraged him to call for pain meds before pain gets so bad

## 2015-05-18 NOTE — Progress Notes (Signed)
Physical Therapy Treatment Patient Details Name: [place] ANTOLIN MRN: [id number] DOB: 28-Jul-1950 Today's Date: 05/18/2015    History of Present Illness pt is a 65 y/o male with h/o alcoholic pancreatitis, HTN, admitted with complaints of R foot non healing wound and claudication with short distance mobility, s/p aortobifemoral bypass with bilateral femoral endarterectomies.    PT Comments    Progressing well.  Hopefully will be at a Mod I to Supervision level at d/c and not nee follow PT at home.  Follow Up Recommendations  No PT follow up     Equipment Recommendations  Rolling walker with 5" wheels    Recommendations for Other Services       Precautions / Restrictions Restrictions Weight Bearing Restrictions: Yes    Mobility  Bed Mobility Overal bed mobility: Modified Independent                Transfers Overall transfer level: Needs assistance Equipment used: Rolling walker (2 wheeled) Transfers: Sit to/from Stand Sit to Stand: Supervision         General transfer comment: cues for hand placement  Ambulation/Gait Ambulation/Gait assistance: Supervision Ambulation Distance (Feet): 250 Feet Assistive device: Rolling walker (2 wheeled) Gait Pattern/deviations: Step-through pattern;Trunk flexed Gait velocity: slower   General Gait Details: postural cues and to stay up close to RW and not pick it up.  Gait though was generally steady and mildly antalgic.   Stairs            Wheelchair Mobility    Modified Rankin (Stroke Patients Only)       Balance Overall balance assessment: Needs assistance Sitting-balance support: No upper extremity supported Sitting balance-Leahy Scale: Fair     Standing balance support: Single extremity supported;Bilateral upper extremity supported Standing balance-Leahy Scale: Fair Standing balance comment: still somewhat reliant on the RW                    Cognition Arousal/Alertness: Awake/alert Behavior  During Therapy: WFL for tasks assessed/performed Overall Cognitive Status: Within Functional Limits for tasks assessed                      Exercises      General Comments        Pertinent Vitals/Pain Pain Assessment: Faces Faces Pain Scale: Hurts little more Pain Location: bil groin Pain Descriptors / Indicators: Sore Pain Intervention(s): Monitored during session    Home Living                      Prior Function            PT Goals (current goals can now be found in the care plan section) Acute Rehab PT Goals Patient Stated Goal: home independent PT Goal Formulation: With patient Time For Goal Achievement: 05/21/15 Potential to Achieve Goals: Good Progress towards PT goals: Progressing toward goals    Frequency  Min 3X/week    PT Plan Current plan remains appropriate    Co-evaluation             End of Session   Activity Tolerance: Patient tolerated treatment well Patient left: in chair;with call bell/phone within reach;with family/visitor present     Time: 1343-1400 PT Time Calculation (min) (ACUTE ONLY): 17 min  Charges:  $Gait Training: 8-22 mins                    G Codes:      Jonnette Nuon, Eliseo Gum  05/18/2015, 2:15 PM 05/18/2015  [place] Bing, PT [phone]  (pager)

## 2015-05-19 LAB — URINALYSIS, DIPSTICK ONLY
Glucose, UA: NEGATIVE mg/dL
Ketones, ur: 15 mg/dL — AB
NITRITE: POSITIVE — AB
PH: 5 (ref 5.0–8.0)
Protein, ur: NEGATIVE mg/dL
SPECIFIC GRAVITY, URINE: 1.029 (ref 1.005–1.030)

## 2015-05-19 LAB — PROTIME-INR
INR: 1.47 (ref 0.00–1.49)
Prothrombin Time: 17.9 seconds — ABNORMAL HIGH (ref 11.6–15.2)

## 2015-05-19 MED ORDER — OXYCODONE-ACETAMINOPHEN 5-325 MG PO TABS: 1.0000 | ORAL_TABLET | ORAL | Status: DC | PRN

## 2015-05-19 MED ORDER — ROSUVASTATIN CALCIUM 10 MG PO TABS: 10.0000 mg | ORAL_TABLET | Freq: Every day | ORAL | Status: DC

## 2015-05-19 MED ORDER — WARFARIN SODIUM 4 MG PO TABS: 8.0000 mg | ORAL_TABLET | Freq: Every day | ORAL | Status: DC

## 2015-05-19 MED ORDER — CARRASYN HYDROGEL WOUND DRESS EX GEL: Freq: Every day | CUTANEOUS | Status: DC

## 2015-05-19 NOTE — Progress Notes (Signed)
Reviewed discharge with patient. No iv in patient. Patient awaiting discharge via wheelchair.  Landin Tallon, Charlaine Dalton RN

## 2015-05-19 NOTE — Progress Notes (Addendum)
  [facility] Progress Note  Subjective  - POD #6  Doing well today. Denies nausea. Had BM yesterday. Complaining of tenderness with penis. Denies dysuria.  Ready to go home.   Objective Filed Vitals:   05/18/15 2206 05/19/15 0552  BP: 114/58 110/72  Pulse: 71 73  Temp: 99.2 F (37.3 C) 98.4 F (36.9 C)  Resp: 18 18    Intake/Output Summary (Last 24 hours) at 05/19/15 0946 Last data filed at 05/19/15 0500  Gross per 24 hour  Intake    240 ml  Output    200 ml  Net     40 ml   Abdomen soft and nontender.  Midline incision and bilateral groin incisions healing well.  Right lateral ankle wound with red granulation tissue.  Feet warm bilaterally.  Penis without any erythema or drainage.   Assessment/Planning: 65 y.o. male is s/p: aortobifemoral bypass, bilateral femoral endarterectomy with profundaplasty 6 Days Post-Op   Tolerated regular diet well.  Ambulating adequately with PT.  Coumadin started yesterday.  Will arrange for INR check later this week.  Colon polyp on CT: will arrange for outpatient GI follow-up after patient has recovered from surgery.  Patient denies dysuria this am, but per RN had symptoms last night. Urine with slight red discoloration. May be from foley trauma. Will order UA.  Discharge home this afternoon. F/u in one week for staple removal.   Raymond Gurney 05/19/2015 9:46 AM -- Agree with above Incisions healing Feet warm D/c home today Follow up 1 week  Fabienne Bruns, MD [facility] [place] Office: [phone] Pager: [phone]  Laboratory CBC    Component Value Date/Time   WBC 10.4 05/17/2015 0438   HGB 12.5* 05/17/2015 0438   HCT 36.5* 05/17/2015 0438   PLT 95* 05/17/2015 0438    BMET    Component Value Date/Time   NA 137 05/17/2015 0438   K 3.6 05/17/2015 0438   CL 105 05/17/2015 0438   CO2 25 05/17/2015 0438   GLUCOSE 104* 05/17/2015 0438   BUN 9 05/17/2015 0438   CREATININE 0.81 05/17/2015 0438   CREATININE 0.75 04/22/2015 1121   CALCIUM 8.3* 05/17/2015 0438   GFRNONAA >60 05/17/2015 0438   GFRAA >60 05/17/2015 0438    COAG Lab Results  Component Value Date   INR 1.47 05/19/2015   INR 1.38 05/15/2015   INR 1.27 05/13/2015   No results found for: PTT  Antibiotics Anti-infectives    Start     Dose/Rate Route Frequency Ordered Stop   05/14/15 0100  cefUROXime (ZINACEF) 1.5 g in dextrose 5 % 50 mL IVPB     1.5 g 100 mL/hr over 30 Minutes Intravenous Every 12 hours 05/13/15 1759 05/14/15 1619   05/13/15 0636  cefUROXime (ZINACEF) 1.5 g in dextrose 5 % 50 mL IVPB     1.5 g 100 mL/hr over 30 Minutes Intravenous 30 min pre-op 05/13/15 0636 05/13/15 1305   05/13/15 0521  dextrose 5 % with cefUROXime (ZINACEF) ADS Med    Comments:  Leandro Reasoner   : cabinet override      05/13/15 0521 05/13/15 1729       Maris Berger, PA-C [facility] Office: [phone] Pager: [phone] 05/19/2015 9:46 AM

## 2015-05-19 NOTE — Care Management Note (Addendum)
Case Management Note  Patient Details  Name: RHET RORKE MRN: [id number] Date of Birth: Apr 06, 1951  Subjective/Objective:    Pt is admitted with aortoilac occlusive disease           Action/Plan:  Pt is from home with spouse.  CM will continue to monitor for discharge needs   Expected Discharge Date:  05/19/15               Expected Discharge Plan:  Home/Self Care  In-House Referral:     Discharge planning Services  CM Consult  Post Acute Care Choice:    Choice offered to:  Patient  DME Arranged:  Dan Humphreys rolling DME Agency:  [facility]  HH Arranged:    [facility] Agency:     Status of Service:  Completed, signed off  Medicare Important Message Given:  Yes Date Medicare IM Given:    Medicare IM give by:    Date Additional Medicare IM Given:    Additional Medicare Important Message give by:     If discussed at Long Length of Stay Meetings, dates discussed:    Additional Comments: 05/19/2015 Pt will dicharge home today with his wife, wife will provide recommended 24 hour supervision.  PT reassessed pt prior to discharge and are no longer recommending HH.  CM requested DME order from bedside nurse, CM offered pt choice of agency, pt chose [facility].  Agency contacted and referral accepted  Pt has NG tube and on morphine PCA  In-house therapy have documented their HH/DME recommendations - CM requested orders via progression meeting and via physician sticky note -  CM will arrange for services once orders are received Cherylann Parr, RN 05/19/2015, 10:52 AM

## 2015-05-19 NOTE — Progress Notes (Signed)
Physical Therapy Treatment Patient Details Name: [name] MRN: [id number] DOB: 12-Nov-1950 Today's Date: 05/19/2015    History of Present Illness pt is a 65 y/o male with h/o alcoholic pancreatitis, HTN, admitted with complaints of R foot non healing wound and claudication with short distance mobility, s/p aortobifemoral bypass with bilateral femoral endarterectomies.    PT Comments    Patient progressing well towards PT goals. Performed stair training this AM with cues for safety. Discussed activity progression and safety at home and car transfer. Improved ambulation distance. Pt will have support at home from family. Plans to d/c later today. Will continue to follow if still in hospital.   Follow Up Recommendations  No PT follow up;Supervision - Intermittent     Equipment Recommendations  Rolling walker with 5" wheels    Recommendations for Other Services       Precautions / Restrictions Precautions Precautions: Fall Restrictions Weight Bearing Restrictions: No    Mobility  Bed Mobility Overal bed mobility: Modified Independent Bed Mobility: Supine to Sit     Supine to sit: Modified independent (Device/Increase time);HOB elevated     General bed mobility comments: Increased time to get to EOB. Use of rails.  Transfers Overall transfer level: Needs assistance Equipment used: Rolling walker (2 wheeled) Transfers: Sit to/from Stand Sit to Stand: Supervision         General transfer comment: vc for hand placement  Ambulation/Gait Ambulation/Gait assistance: Supervision Ambulation Distance (Feet): 250 Feet Assistive device: Rolling walker (2 wheeled) Gait Pattern/deviations: Step-through pattern;Trunk flexed;Decreased stance time - left Gait velocity: slower Gait velocity interpretation: Below normal speed for age/gender General Gait Details: Cues for upright posture and RW proximity. Adjusted RW height to facilitate upright. 2 short standing rest breaks. HR  stable.   Stairs Stairs: Yes Stairs assistance: Min guard Stair Management: Forwards Number of Stairs: 3 General stair comments: Cues for technique/safety.   Wheelchair Mobility    Modified Rankin (Stroke Patients Only)       Balance Overall balance assessment: Needs assistance Sitting-balance support: Feet supported;No upper extremity supported Sitting balance-Leahy Scale: Good     Standing balance support: During functional activity Standing balance-Leahy Scale: Fair Standing balance comment: Reliant on RW for support.                     Cognition Arousal/Alertness: Awake/alert Behavior During Therapy: WFL for tasks assessed/performed Overall Cognitive Status: Within Functional Limits for tasks assessed                      Exercises      General Comments General comments (skin integrity, edema, etc.): Discussed car transfer, activity progression at home.       Pertinent Vitals/Pain Pain Assessment: 0-10 Pain Score: 7  Pain Location: foot Pain Descriptors / Indicators: Sore Pain Intervention(s): Monitored during session;Repositioned    Home Living                      Prior Function            PT Goals (current goals can now be found in the care plan section) Progress towards PT goals: Progressing toward goals    Frequency  Min 3X/week    PT Plan Current plan remains appropriate    Co-evaluation             End of Session Equipment Utilized During Treatment: Gait belt Activity Tolerance: Patient tolerated treatment well Patient left: in  chair;with call bell/phone within reach     Time: 1108-1130 PT Time Calculation (min) (ACUTE ONLY): 22 min  Charges:  $Gait Training: 8-22 mins                    G Codes:      [name] A Elveria Lauderbaugh 05/19/2015, 12:03 PM Mylo Red, PT, DPT [phone]

## 2015-05-19 NOTE — Progress Notes (Signed)
Patient stating that he is having slight pain only lwhen tries to urinate. Area appears slightly swollen. Advised to let me know if it begins hurting constantly, will continue to monitor.

## 2015-05-20 NOTE — Progress Notes (Signed)
  [facility] Progress Note  UTI on urinalysis. Discussed abx with Dr. Darrick Penna. Will start cipro 500 bid x 7 days. Called patient to tell him to inform his PCP during INR appointment tomorrow that he is on cipro.  Maris Berger, PA-C [facility] Office: [phone] Pager: [phone] 05/20/2015 11:07 AM

## 2015-05-22 ENCOUNTER — Telehealth: Payer: Self-pay | Admitting: Vascular Surgery

## 2015-05-22 NOTE — Telephone Encounter (Signed)
Spoke with pt to schedule, dpm °

## 2015-05-22 NOTE — Telephone Encounter (Signed)
-----   Message from Fredrich Birks sent at 05/21/2015  2:30 PM EST ----- Regarding: FW: schedule   ----- Message -----    From: Sharee Pimple, RN    Sent: 05/19/2015  10:17 AM      To: [facility] Admin Pool Subject: schedule                                         ----- Message -----    From: Raymond Gurney, PA-C    Sent: 05/19/2015   9:56 AM      To: [facility] Charge Pool  S/p aortobifem 05/13/15  F/u with Dr. Darrick Penna in one week.  Thanks Selena Batten

## 2015-05-23 NOTE — Discharge Summary (Signed)
[facility] Discharge Summary  [name] [birth year] 65 y.o. male  [id number]  Admission Date: 05/13/2015  Discharge Date: 05/19/2015  Physician: Fabienne Bruns, MD  Admission Diagnosis: Peripheral vascular disease with bilateral lower extremity claudication I70.213  HPI:   This is a 65 y.o. male who presented for evaluation ofa nonhealing wound right foot. He was seen approximate 2 weeks ago. At the time of that evaluation his physical exam suggested aortoiliac occlusive disease. He returns today for follow-up after recent cardiology evaluation as well as a CT angiogram of the abdomen and pelvis with runoff. The patient developed a blister over the right lateral malleolus 3-4 months ago. This has slowly enlarged despite going to the [facility] wound center. It is unchanged over the last 2 weeks.He also has short distance claudication in this leg at 1 block. He denies rest pain in the foot. He does have shortness of breath with exertion. He denies chest pain. He denies any prior operations on his leg except for a right hip fracture several years ago. 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 He is on Coumadin for this. This is frequently checked by Dr. Julie [place] in [place]. He currently smokes one half pack of cigarettes per day. He states he is ready to quit. Greater than 3 minutes today were spent regarding smoking cessation. Other medical problems include Hypertension and prior history of alcoholic pancreatitis. These are both currently stable.   Hospital Course:  The patient was admitted to the hospital and taken to the operating room on 05/13/2015 and underwent: aortobifemoral bypass, bilateral femoral endarterectomy with profundaplasty.   The patient tolerated the procedure well and was transported to the PACU in stable condition.   POD 1: The patient was not passing flatus yet and had moderate NG output. He had good flow  to this feet. He was kept NPO. His serum creatinine was stable with good urine output. Hydrogel dressings were started to his right lateral ankle wound. Lovenox was started for DVT prophylaxis. He was started on a reduced dose PCA. He was transferred to 2W.   POD 2: He still had some abdominal pain. His NG tube was kept in.   POD 3: He was not passing flatus yet. He continued to have moderate NG output. He was kept NPO. Toradol was added for pain as the patient remained uncomfortable.   POD 4: The patient felt much better today. He started passing flatus and his NG tube was discontinued. He was started on a clear liquid diet. He was kept on lovenox with plans to resume coumadin once taking po's.   POD 5:  He was tolerating a clear liquid diet and his diet was advanced. He continued to pass flatus and had a BM. He had an isolated incident with HR in the 140s. His coumadin was restarted.   POD 6: The patient tolerated a regular diet well. He had complained of dysuria the previous night and tenderness around his penis. There was no evidence of infection on physical exam. He had a BM and denied abdominal pain. A UA was ordered and came back positive for a UTI. He was discharged home with antibiotics and an INR follow-up. He was given instructions to notify the coumadin clinic that he was on concomitant antibiotics. He was discharged home in good condition.   CBC    Component Value Date/Time   WBC 10.4 05/17/2015 0438   RBC 3.95* 05/17/2015 7829  HGB 12.5* 05/17/2015 0438   HCT 36.5* 05/17/2015 0438   PLT 95* 05/17/2015 0438   MCV 92.4 05/17/2015 0438   MCH 31.6 05/17/2015 0438   MCHC 34.2 05/17/2015 0438   RDW 13.1 05/17/2015 0438   LYMPHSABS 2.7 06/26/2009 1446   MONOABS 0.5 06/26/2009 1446   EOSABS 0.1 06/26/2009 1446   BASOSABS 0.0 06/26/2009 1446    BMET    Component Value Date/Time   NA 137 05/17/2015 0438   K 3.6 05/17/2015 0438   CL 105 05/17/2015 0438   CO2 25 05/17/2015 0438    GLUCOSE 104* 05/17/2015 0438   BUN 9 05/17/2015 0438   CREATININE 0.81 05/17/2015 0438   CREATININE 0.75 04/22/2015 1121   CALCIUM 8.3* 05/17/2015 0438   GFRNONAA >60 05/17/2015 0438   GFRAA >60 05/17/2015 0438     Discharge Instructions:   The patient is discharged to home with extensive instructions on wound care and progressive ambulation. They are instructed not to drive or perform any heavy lifting until returning to see the physician in his office.  Discharge Instructions    ABDOMINAL PROCEDURE/ANEURYSM REPAIR/AORTO-BIFEMORAL BYPASS:  Call MD for increased abdominal pain; cramping diarrhea; nausea/vomiting    Complete by:  As directed      Call MD for:  redness, tenderness, or signs of infection (pain, swelling, bleeding, redness, odor or green/yellow discharge around incision site)    Complete by:  As directed      Call MD for:  severe or increased pain, loss or decreased feeling  in affected limb(s)    Complete by:  As directed      Call MD for:  temperature >100.5    Complete by:  As directed      Discharge wound care:    Complete by:  As directed   Wash abdominal wound and groin wounds daily with soap and water and pat dry. You do not need to apply any creams or ointments to your incisions.   Right ankle wound: Apply hydrogel daily to wound and cover with dry gauze. OR you may apply a moist gauze with saline to wound and cover with dry gauze two times a day.     Driving Restrictions    Complete by:  As directed   No driving for 2 weeks     Increase activity slowly    Complete by:  As directed   Walk with assistance use walker or cane as needed     Lifting restrictions    Complete by:  As directed   No lifting for 4 weeks     Resume previous diet    Complete by:  As directed            Discharge Diagnosis:  Peripheral vascular disease with bilateral lower extremity claudication I70.213  Secondary Diagnosis: Patient Active Problem List   Diagnosis Date Noted    . Pressure ulcer 05/14/2015  . 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 Colon polyp 04/30/2015  . PAD (peripheral artery disease) (HCC) 04/30/2015  . DVT of leg (deep venous thrombosis) (HCC) 02/18/2011   Past Medical History  Diagnosis Date  . Pancreatitis, alcoholic   . Hypertension     pt. denies htn,was treated 10 yrs, ago,not on any meds now  . 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 Apply daily to right ankle wound and cover with dry gauze.     aspirin 81 MG chewable tablet  Chew 81 mg by mouth daily.     gabapentin 100 MG capsule  Commonly known as:  NEURONTIN  Take 100 mg by mouth 3 (three) times daily as needed. For pain     oxyCODONE-acetaminophen 5-325 MG tablet  Commonly known as:  PERCOCET/ROXICET  Take 1-2 tablets by mouth every 4 (four) hours as needed for moderate pain.     PROVENTIL HFA 108 (90 Base) MCG/ACT inhaler  Generic drug:  albuterol  Inhale 2 puffs into the lungs every 6 (six) hours as needed for wheezing or shortness of breath.     rosuvastatin 10 MG tablet  Commonly known as:  CRESTOR  Take 1 tablet (10 mg total) by mouth daily.     traMADol 50 MG tablet  Commonly known as:  ULTRAM  Take 100 mg by mouth 2 (two) times daily. Pain     warfarin 4 MG tablet  Commonly known as:  COUMADIN  Take 2 tablets (8 mg total) by mouth daily. Take 3 tablets (12 mg) today (05/19/15). Tomorrow 05/20/15, take your usual home dose of 8 mg (2 tablets).        Percocet #30 No Refill  Disposition: Home  Patient's condition: is Good  Follow up: 1. Dr. Darrick Penna in 1 weeks 2. Coumadin clinic  Maris Berger, [place] [facility] [phone] 05/23/2015  1:04 PM   - For VQI Registry use ---   Post-op:  Time to Extubation: [x ] In OR,  < 12 hrs,  12-24 hrs,  >=24 hrs Vasopressors Req.  Post-op: No ICU Stay: 1 days Transfusion: No   MI: No,  Troponin only,  EKG or Clinical New Arrhythmia: No  Complications: CHF: No Resp failure: No,  Pneumonia,  Ventilator Chg in renal function: No,  Inc. Cr > 0.5,  Temp. Dialysis,  Permanent dialysis Leg ischemia: No, no Surgery needed,  Yes, Surgery needed,  Amputation Bowel ischemia: No,  Medical Rx,  Surgical Rx Wound complication: No,  Superficial separation/infection,  Return to OR Return to OR: No  Return to OR for bleeding: No Stroke: No,  Minor,  Major  Discharge medications: Statin use:  Yes If No:  For Medical reasons,  Non-compliant ASA use:  Yes  If No:  For Medical reasons,  Non-compliant Plavix use:  No If No: [x ] For Medical reasons,  Non-compliant Beta blocker use:  No If No: [x ] For Medical reasons,  Non-compliant

## 2015-05-26 ENCOUNTER — Encounter: Payer: Self-pay | Admitting: Family

## 2015-05-26 ENCOUNTER — Ambulatory Visit (INDEPENDENT_AMBULATORY_CARE_PROVIDER_SITE_OTHER): Payer: Medicare Other | Admitting: Family

## 2015-05-26 VITALS — BP 99/72 | HR 68 | Temp 97.6°F | Ht 66.0 in | Wt 159.0 lb

## 2015-05-26 DIAGNOSIS — Z95828 Presence of other vascular implants and grafts: Secondary | ICD-10-CM

## 2015-05-26 DIAGNOSIS — T148 Other injury of unspecified body region: Secondary | ICD-10-CM

## 2015-05-26 DIAGNOSIS — IMO0002 Reserved for concepts with insufficient information to code with codable children: Secondary | ICD-10-CM

## 2015-05-26 DIAGNOSIS — Z87891 Personal history of nicotine dependence: Secondary | ICD-10-CM

## 2015-05-26 DIAGNOSIS — I779 Disorder of arteries and arterioles, unspecified: Secondary | ICD-10-CM

## 2015-05-26 NOTE — Progress Notes (Signed)
    Postoperative Visit   History of Present Illness  [name] is a 65 y.o. year old male patient of Dr. Darrick Penna who is s/p aortobifemoral bypass, bilateral endarterectomy with profundoplasty on 05/13/15. He returns today with c/o swelling and pain in right groin. He is taking oxycodone for pain. He also has a smaller swelling in left groin.  He is on Cipro which he is about to finish. He denies fever or chills. He resumed coumadin yesterday; he has a hx of DVT and PE. He states is right ankle wound is healing well since the above surgery.  He had a hx of a nonhealing wound right foot. At a December 2016 evaluation his physical exam suggested aortoiliac occlusive disease, after a cardiology evaluation as well as a CT angiogram of the abdomen and pelvis with runoff. The patient developed a blister over the right lateral malleolus 3-4 months prior to his initial evaluation by Dr. Darrick Penna. This had slowly enlarged despite going to the [facility] wound center. He also had short distance claudication in this leg at 1 block. He denied rest pain in the foot. He does had shortness of breath with exertion. He denied chest pain. He denies any prior operations on his leg except for a right hip fracture several years ago. He did have prior DVT and pulmonary embolus about 2007. He is on Coumadin for this. This is frequently checked by Dr. Julie [place] in [place]. He was smoking one half pack of cigarettes per day. He quit since the January 2017 surgery.  Other medical problems includehypertension and prior history of alcoholic pancreatitis.   The patient is able to complete their activities of daily living.    For VQI Use Only  PRE-ADM LIVING: Home  AMB STATUS: Ambulatory  Physical Examination  Filed Vitals:   05/26/15 1102  BP: 99/72  Pulse: 68  Temp: 97.6 F (36.4 C)  TempSrc: Oral  Height:  (1.676 m)  Weight: 159 lb (72.122 kg)  SpO2: 96%   Body mass index is 25.68  kg/(m^2).  Abdominal incision edges are well proximated with staples, no erythema, no drainage. Both groins have fairly soft lumps, right is larger than left and more painful to touch than left groin. No erythema in either groin.  Pedal pulses have audible Doppler signal bilaterally.   Medical Decision Making  [name] is a 65 y.o. year old male who presents s/p aortobifemoral bypass, bilateral endarterectomy with profundoplasty on 05/13/15. Dr. Arbie Cookey aspirated right groin seroma, about 30 cc aspirated, a few cc's sent for culture and sensitivity. Finish Cipro. See Dr. Darrick Penna in 2 days as scheduled.   The patient's bypass incisions are  healing appropriately with resolution of pre-operative symptoms.  Satine Hausner, Carma Lair, RN, MSN, FNP-C [facility] [place] Office: [phone]  05/26/2015, 11:14 AM  Clinic MD: Early

## 2015-05-27 ENCOUNTER — Encounter: Payer: Self-pay | Admitting: Vascular Surgery

## 2015-05-28 ENCOUNTER — Ambulatory Visit (INDEPENDENT_AMBULATORY_CARE_PROVIDER_SITE_OTHER): Payer: Medicare Other | Admitting: Vascular Surgery

## 2015-05-28 ENCOUNTER — Encounter: Payer: Self-pay | Admitting: Vascular Surgery

## 2015-05-28 VITALS — BP 126/74 | HR 70 | Temp 97.1°F | Resp 14 | Ht 66.0 in | Wt 155.0 lb

## 2015-05-28 DIAGNOSIS — I350 Nonrheumatic aortic (valve) stenosis: Secondary | ICD-10-CM

## 2015-05-28 DIAGNOSIS — I739 Peripheral vascular disease, unspecified: Secondary | ICD-10-CM

## 2015-05-28 NOTE — Progress Notes (Signed)
POST OPERATIVE OFFICE NOTE    CC:  F/u for surgery  HPI:  This is a 65 y.o. male who is s/p aortobifemoral bypass, bilateral endarterectomy with profundo26plasty on 05/13/15.  He was seen in our office on 05/26/2015 by our NP secondary to increased swelling in bilateral groins.  Dr. Arbie Cookey aspirated right groin seroma, about 30 cc aspirated, a few cc's sent for culture and sensitivity.  The culture was negative. He is here for a follow up visit and for staple removal of the abdominal incision.   Allergies  Allergen Reactions  . Sulfa Antibiotics Itching and Rash    Current Outpatient Prescriptions  Medication Sig Dispense Refill  . albuterol (PROVENTIL HFA) 108 (90 Base) MCG/ACT inhaler Inhale 2 puffs into the lungs every 6 (six) hours as needed for wheezing or shortness of breath.    Marland Kitchen aspirin 81 MG chewable tablet Chew 81 mg by mouth daily.    Marland Kitchen gabapentin (NEURONTIN) 100 MG capsule Take 100 mg by mouth 3 (three) times daily as needed. For pain    . rosuvastatin (CRESTOR) 10 MG tablet Take 1 tablet (10 mg total) by mouth daily. 30 tablet 11  . traMADol (ULTRAM) 50 MG tablet Take 100 mg by mouth 2 (two) times daily. Pain    . warfarin (COUMADIN) 4 MG tablet Take 2 tablets (8 mg total) by mouth daily. Take 3 tablets (12 mg) today (05/19/15). Tomorrow 05/20/15, take your usual home dose of 8 mg (2 tablets). 30 tablet 0  . Wound Dressings (ALLANTOIN) gel Apply topically daily. Apply daily to right ankle wound and cover with dry gauze. 15 g 0  . oxyCODONE-acetaminophen (PERCOCET/ROXICET) 5-325 MG tablet Take 1-2 tablets by mouth every 4 (four) hours as needed for moderate pain. (Patient not taking: Reported on 05/28/2015) 30 tablet 0   No current facility-administered medications for this visit.     ROS:  See HPI  Physical Exam:  Filed Vitals:   05/28/15 1322  BP: 126/74  Pulse: 70  Temp: 97.1 F (36.2 C)  Resp: 14    Incision:  Abdominal incision is well healed  Left groin is  well healed.  The right groin has firm edema, the skin is intact without pain or erythema.  Distally his feet are warm to ouch active range of motion is intact.   Extremities:  Right lateral malleolus wound with yellow eschar80% and beefy case 20% Under clean conditions I debrided the yellow eschar 50/50% beefy red base at the finish.     Assessment/Plan:  This is a 65 y.o. male who is s/p:  aortobifemoral bypass, bilateral endarterectomy with profundoplasty  -He tolerated the staple removal and debridement of the ankle. Hydrogel dressings Q day, dial soap washes and follow up in 4 weeks with Dr. Darrick Penna.   Thomasena Edis EMMA [facility] PA-C [facility] [phone]  Clinic MD:  Pt seen and examined with Dr. Darrick Penna  History and exam findings as above. All incisions are essentially healed at this point. Staples were removed from the abdomen today. He does not really have a seroma in the left groin. However he does have a 3 x 3 cm fluid collection in the right groin. There is no erythema. There is no suggestion of infection. He is not having pain from this. The skin is not compromised. Culture was negative today. I would favor observation for now. The patient will follow-up with me in 1 month to recheck the wound on his right leg to  see if this is completely healed. He'll do hydrogel dressings daily. If he has increased size of the seroma or any indication of infection we would consider I&D at that point. Otherwise I believe risk of infection outweighs any benefit to draining this currently.  Fabienne Bruns, MD [facility] [place] Office: [phone] Pager: [phone]

## 2015-05-29 ENCOUNTER — Encounter: Payer: Self-pay | Admitting: Vascular Surgery

## 2015-06-04 ENCOUNTER — Encounter: Payer: Self-pay | Admitting: Vascular Surgery

## 2015-06-11 ENCOUNTER — Encounter: Payer: Self-pay | Admitting: Vascular Surgery

## 2015-06-11 ENCOUNTER — Ambulatory Visit (INDEPENDENT_AMBULATORY_CARE_PROVIDER_SITE_OTHER): Payer: Medicare Other | Admitting: Vascular Surgery

## 2015-06-11 ENCOUNTER — Other Ambulatory Visit: Payer: Self-pay | Admitting: *Deleted

## 2015-06-11 VITALS — BP 137/82 | HR 53 | Temp 97.0°F | Resp 14 | Ht 66.0 in | Wt 162.0 lb

## 2015-06-11 DIAGNOSIS — I739 Peripheral vascular disease, unspecified: Secondary | ICD-10-CM

## 2015-06-11 DIAGNOSIS — R350 Frequency of micturition: Secondary | ICD-10-CM

## 2015-06-11 DIAGNOSIS — Z48812 Encounter for surgical aftercare following surgery on the circulatory system: Secondary | ICD-10-CM

## 2015-06-11 NOTE — Progress Notes (Signed)
POST OPERATIVE OFFICE NOTE    CC:  F/u for surgery  HPI:  This is a 65 y.o. male who is s/p aortobifemoral bypass, bilateral endarterectomy with profundoplasty on 05/13/15.  He was seen in our office on 05/26/2015 by our NP secondary to increased swelling in bilateral groins.  Dr. Arbie Cookey aspirated right groin seroma, about 30 cc aspirated, a few cc's sent for culture and sensitivity.  The culture was negative. He is here for a follow up visit and for staple removal of the abdominal incision. He is complaining of urinary frequency without dysuria.     Allergies   Allergen  Reactions   .  Sulfa Antibiotics  Itching and Rash       Current Outpatient Prescriptions   Medication  Sig  Dispense  Refill   .  albuterol (PROVENTIL HFA) 108 (90 Base) MCG/ACT inhaler  Inhale 2 puffs into the lungs every 6 (six) hours as needed for wheezing or shortness of breath.       Marland Kitchen  aspirin 81 MG chewable tablet  Chew 81 mg by mouth daily.       Marland Kitchen  gabapentin (NEURONTIN) 100 MG capsule  Take 100 mg by mouth 3 (three) times daily as needed. For pain       .  rosuvastatin (CRESTOR) 10 MG tablet  Take 1 tablet (10 mg total) by mouth daily.  30 tablet  11   .  traMADol (ULTRAM) 50 MG tablet  Take 100 mg by mouth 2 (two) times daily. Pain       .  warfarin (COUMADIN) 4 MG tablet  Take 2 tablets (8 mg total) by mouth daily. Take 3 tablets (12 mg) today (05/19/15). Tomorrow 05/20/15, take your usual home dose of 8 mg (2 tablets).  30 tablet  0   .  Wound Dressings (ALLANTOIN) gel  Apply topically daily. Apply daily to right ankle wound and cover with dry gauze.  15 g  0   .  oxyCODONE-acetaminophen (PERCOCET/ROXICET) 5-325 MG tablet  Take 1-2 tablets by mouth every 4 (four) hours as needed for moderate pain. (Patient not taking: Reported on 05/28/2015)  30 tablet  0      No current facility-administered medications for this visit.      ROS:  See HPI  Physical Exam:     Filed Vitals:   06/11/15 1532  BP: 137/82    Pulse: 53  Temp: 97 F (36.1 C)  TempSrc: Oral  Resp: 14  Height:  (1.676 m)  Weight: 162 lb (73.483 kg)  SpO2: 98%   Incision:  Abdominal incision is well healed  Left groin is well healed with the exception of the inferior aspect which had some maceration of the skin. There was a 1 mm opening in this area.   The right groin has a 4 x 4 centimeter mass which is fluctuant, the skin is intact without pain or erythema.  Distally his feet are warm to ouch active range of motion is intact.    Extremities:  Right lateral malleolus wound proximal 6 mm diameter 20% healed from prior visit    Assessment/Plan:  This is a 65 y.o. male who is s/p:  aortobifemoral bypass, bilateral endarterectomy with profundoplasty   He does not really have a seroma in the left groin but does have some maceration at the inferior aspect of the incision. However he does have a 4 x 4 cm fluid collection in the right groin. There is  no erythema. There is no suggestion of infection. He is not having pain from this. The skin is not compromised. Culture was negative today. I would favor observation for now. The patient will follow-up with me in 1 month to recheck the wound on his left leg to see if this is completely healed. He'll do hydrogel dressings daily on his right lateral malleolus wound. If he has increased size of the seroma or any indication of infection we would consider I&D at that point. Otherwise I believe risk of infection outweighs any benefit to draining this currently. We will get repeat ABIs at this office visit.  As far as his urinary frequency is concerned we'll check a urinalysis and urine culture.  Fabienne Bruns, MD [facility] [place] Office: [phone] Pager: [phone]

## 2015-06-11 NOTE — Addendum Note (Signed)
Addended by: Sharee Pimple on: 06/11/2015 04:26 PM   Modules accepted: Orders

## 2015-06-23 LAB — URINALYSIS, ROUTINE W REFLEX MICROSCOPIC
BILIRUBIN URINE: NEGATIVE
GLUCOSE, UA: NEGATIVE
Ketones, ur: NEGATIVE
Leukocytes, UA: NEGATIVE
Nitrite: NEGATIVE
PROTEIN: NEGATIVE
SPECIFIC GRAVITY, URINE: 1.008 (ref 1.001–1.035)
pH: 6 (ref 5.0–8.0)

## 2015-06-23 LAB — URINALYSIS, MICROSCOPIC ONLY
BACTERIA UA: NONE SEEN [HPF]
CRYSTALS: NONE SEEN [HPF]
Casts: NONE SEEN [LPF]
RBC / HPF: NONE SEEN RBC/HPF (ref <=2)
Squamous Epithelial / LPF: NONE SEEN [HPF] (ref <=5)
WBC, UA: NONE SEEN WBC/HPF (ref <=5)
YEAST: NONE SEEN [HPF]

## 2015-07-08 ENCOUNTER — Encounter: Payer: Self-pay | Admitting: Vascular Surgery

## 2015-07-13 ENCOUNTER — Ambulatory Visit (HOSPITAL_COMMUNITY)
Admission: RE | Admit: 2015-07-13 | Discharge: 2015-07-13 | Disposition: A | Discharge status: Home / Self Care | Payer: Medicare Other | Source: Ambulatory Visit | Attending: Vascular Surgery | Admitting: Vascular Surgery

## 2015-07-13 DIAGNOSIS — R0989 Other specified symptoms and signs involving the circulatory and respiratory systems: Secondary | ICD-10-CM | POA: Diagnosis present

## 2015-07-13 DIAGNOSIS — I739 Peripheral vascular disease, unspecified: Secondary | ICD-10-CM | POA: Diagnosis not present

## 2015-07-13 DIAGNOSIS — Z48812 Encounter for surgical aftercare following surgery on the circulatory system: Secondary | ICD-10-CM | POA: Diagnosis not present

## 2015-07-13 DIAGNOSIS — R938 Abnormal findings on diagnostic imaging of other specified body structures: Secondary | ICD-10-CM | POA: Diagnosis not present

## 2015-07-13 DIAGNOSIS — I10 Essential (primary) hypertension: Secondary | ICD-10-CM | POA: Insufficient documentation

## 2015-07-15 ENCOUNTER — Other Ambulatory Visit: Payer: Self-pay | Admitting: *Deleted

## 2015-07-15 ENCOUNTER — Encounter: Payer: Self-pay | Admitting: Vascular Surgery

## 2015-07-15 ENCOUNTER — Telehealth: Payer: Self-pay

## 2015-07-15 ENCOUNTER — Ambulatory Visit (INDEPENDENT_AMBULATORY_CARE_PROVIDER_SITE_OTHER): Payer: Medicare Other | Admitting: Vascular Surgery

## 2015-07-15 VITALS — BP 161/89 | HR 53 | Temp 96.9°F | Resp 16 | Ht 66.0 in | Wt 159.0 lb

## 2015-07-15 DIAGNOSIS — R339 Retention of urine, unspecified: Secondary | ICD-10-CM

## 2015-07-15 DIAGNOSIS — N390 Urinary tract infection, site not specified: Secondary | ICD-10-CM

## 2015-07-15 DIAGNOSIS — M25571 Pain in right ankle and joints of right foot: Secondary | ICD-10-CM

## 2015-07-15 DIAGNOSIS — I739 Peripheral vascular disease, unspecified: Secondary | ICD-10-CM

## 2015-07-15 DIAGNOSIS — Z86010 Personal history of colonic polyps: Secondary | ICD-10-CM

## 2015-07-15 DIAGNOSIS — R509 Fever, unspecified: Secondary | ICD-10-CM

## 2015-07-15 MED ORDER — CIPROFLOXACIN HCL 500 MG PO TABS: 500.0000 mg | ORAL_TABLET | Freq: Two times a day (BID) | ORAL | Status: DC

## 2015-07-15 NOTE — Telephone Encounter (Signed)
Rec'd verbal order by Dr. Darrick PennaFields to obtain urine for UA/ UC, and to refer pt. Back to GI for prev. hx of colon polyp.  Notified wife of need for obtaining a urine specimen for UA/ UC prior to starting the antibiotic. Verb. Understanding.

## 2015-07-15 NOTE — Progress Notes (Signed)
Filed Vitals:   07/15/15 0837 07/15/15 0840  BP: 164/88 161/89  Pulse: 55 53  Temp: 96.9 F (36.1 C)   Resp: 16   Height: 5\' 6"  (1.676 m)   Weight: 159 lb (72.122 kg)   SpO2: 100%

## 2015-07-15 NOTE — Progress Notes (Addendum)
POST OPERATIVE OFFICE NOTE    CC:  F/u for surgery  HPI:  This is a 65 y.o. male who is s/p aortobifemoral bypass, bilateral endarterectomy with profundoplasty on 05/13/15.  He was seen in our office on 05/26/2015 by our NP secondary to increased swelling in bilateral groins.  Dr. Arbie Cookey aspirated right groin seroma, about 30 cc aspirated, a few cc's sent for culture and sensitivity.  The culture was negative. He is here for a follow up visit and for staple removal of the abdominal incision. He is complaining of urinary frequency without dysuria. He is also complaining of some nocturia. He denies any incontinence. Recent urinalysis was nitrite and leukocyte positive. He also has a history of a transverse colon polyp suggested by CT scan. On sedation with GI prior to his aortobifemoral bypass was to defer this until after he had completely healed from his aortobifem. He is on Coumadin for DVT.  Past Medical History  Diagnosis Date  . Pancreatitis, alcoholic   . Hypertension     pt. denies htn,was treated 10 yrs, ago,not on any meds now  . 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   Sulfa Antibiotics   Itching and Rash        Current Outpatient Prescriptions    Medication   Sig   Dispense   Refill    .   albuterol (PROVENTIL HFA) 108 (90 Base) MCG/ACT inhaler   Inhale 2 puffs into the lungs every 6 (six) hours as needed for wheezing or shortness of breath.          Marland Kitchen   aspirin 81 MG chewable tablet   Chew 81 mg by mouth daily.          Marland Kitchen   gabapentin (NEURONTIN) 100 MG capsule   Take 100 mg by mouth 3 (three) times daily as needed. For pain          .   rosuvastatin (CRESTOR) 10 MG tablet   Take 1 tablet (10 mg total) by mouth daily.   30 tablet   11    .   traMADol (ULTRAM) 50 MG tablet   Take 100 mg by mouth 2 (two) times daily. Pain          .   warfarin (COUMADIN) 4 MG tablet   Take 2 tablets (8 mg total)  by mouth daily. Take 3 tablets (12 mg) today (05/19/15). Tomorrow 05/20/15, take your usual home dose of 8 mg (2 tablets).   30 tablet   0    .   Wound Dressings (ALLANTOIN) gel   Apply topically daily. Apply daily to right ankle wound and cover with dry gauze.   15 g   0    .   oxyCODONE-acetaminophen (PERCOCET/ROXICET) 5-325 MG tablet   Take 1-2 tablets by mouth every 4 (four) hours as needed for moderate pain. (Patient not taking: Reported on 05/28/2015)   30 tablet   0       No current facility-administered medications for this visit.       ROS:  See HPI  Physical Exam:    Filed Vitals:   07/15/15 0837 07/15/15 0840  BP: 164/88 161/89  Pulse: 55 53  Temp: 96.9 F (36.1 C)   Resp: 16   Height:  (1.676 m)  Weight: 159 lb (72.122 kg)   SpO2: 100%     Incision:  Abdominal incision is well healed  Left groin is well healed The right groin has a 3 x 3 centimeter mass which is fluctuant, the skin is intact without pain or erythema.  Distally his feet are warm to ouch active range of motion is intact.    Extremities:  Right lateral malleolus wound proximal 6 mm diameter 20% healed from prior visit.Persistent    Assessment/Plan:  This is a 65 y.o. male who is s/p:  aortobifemoral bypass, bilateral endarterectomy with profundoplasty    persistent but improved seroma right groin essentially resolved seroma left groin continue to observe We will get repeat ABIs at this office visit.  As far as his urinary frequency is concerned we will re- check a urinalysis and urine culture. We'll also start him empirically on Cipro today. We will follow up with his primary care physician regarding his Coumadin since this may elevate INR.  We will send him to see Dr. Lajoyce Cornersuda for his lateral malleolus ulcer to make sure there is no underlying osteomyelitis. He'll continue dry dressings for this.  Fabienne Brunsharles Fields, MD [facility] [place] Office: [phone] Pager:  [phone]

## 2015-07-15 NOTE — Addendum Note (Signed)
Addended by: Phillips OdorPULLINS, Ala Capri S on: 07/15/2015 11:06 AM   Modules accepted: Orders

## 2015-07-15 NOTE — Telephone Encounter (Signed)
Phone call from pt's wife.  Requested pt. Is seeing Dr. Darrick PennaFields today, and wanted to make him aware of fever at night and increased frequency of urination.  Stated the fever increases to 99.8 at night, and also, he is up multiple times to urinate at night.  Dr. Darrick PennaFields made aware.  Reviewed last UA.  Recommended to started Cipro 500 mg bid x 7 days, and to have Coumadin clinic to check the INR in a few days.  Notified pt's wife of recommendation per Dr. Darrick PennaFields.  Will contact pt's PCP and make aware of the order for Cipro and need for INR check in a few days.

## 2015-07-16 ENCOUNTER — Encounter (HOSPITAL_COMMUNITY): Payer: Medicare Other

## 2015-07-16 ENCOUNTER — Ambulatory Visit: Payer: Medicare Other | Admitting: Vascular Surgery

## 2015-07-16 ENCOUNTER — Other Ambulatory Visit: Payer: Self-pay

## 2015-07-22 ENCOUNTER — Telehealth: Payer: Self-pay | Admitting: Vascular Surgery

## 2015-07-22 ENCOUNTER — Ambulatory Visit: Payer: Medicare Other | Admitting: Cardiology

## 2015-07-22 NOTE — Progress Notes (Unsigned)
Patient ID: Cleophas DunkerRex A Odenthal, male   DOB: 02/09/1951, 65 y.o.   MRN: [id number]     Clinical Summary [name] is a 65 y.o.male see today for follow up of the following medical problems.   1. PAD - followed by vascular - patient with history of nonhealing right foot sore as well as claudication He is being considered for surgery.  - s/p aortobifemroal bypass and bilateral endarterctomy Jan 2017 - prior to surgery he was risk stratified from a cardiac standpoint with a Lexiscan that showed no significant ischemia  2. 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 Pancreatitis, alcoholic   . Hypertension     pt. denies htn,was treated 10 yrs, ago,not on any meds now  . 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 Sulfa Antibiotics Itching and Rash     Current Outpatient Prescriptions  Medication Sig Dispense Refill  . albuterol (PROVENTIL HFA) 108 (90 Base) MCG/ACT inhaler Inhale 2 puffs into the lungs every 6 (six) hours as needed for wheezing or shortness of breath.    Marland Kitchen. aspirin 81 MG chewable tablet Chew 81 mg by mouth daily.    . ciprofloxacin (CIPRO) 500 MG tablet Take 1 tablet (500 mg total) by mouth 2 (two) times daily. 14 tablet 0  . gabapentin (NEURONTIN) 100 MG capsule Take 100 mg by mouth 3 (three) times daily as needed. For pain    . oxyCODONE-acetaminophen (PERCOCET/ROXICET) 5-325 MG tablet Take 1-2 tablets by mouth every 4 (four) hours as needed for moderate pain. (Patient not taking: Reported on 05/28/2015) 30 tablet 0  . rosuvastatin (CRESTOR) 10 MG tablet Take 1 tablet (10 mg total) by mouth daily. 30 tablet 11  . traMADol (ULTRAM) 50 MG tablet Take 100 mg by mouth 2 (two) times daily. Pain    . warfarin (COUMADIN) 4 MG tablet Take 2 tablets (8 mg total) by mouth daily. Take 3 tablets (12 mg) today (05/19/15). Tomorrow 05/20/15, take your usual home dose of 8 mg (2 tablets). 30  tablet 0  . Wound Dressings (ALLANTOIN) gel Apply topically daily. Apply daily to right ankle wound and cover with dry gauze. 15 g 0   No current facility-administered medications for this visit.     Past Surgical History  Procedure Laterality Date  . Hip surgery      right  . Aorta - bilateral femoral artery bypass graft Bilateral 05/13/2015    Procedure: AORTO BIFEMORAL BYPASS GRAFT USING 14 X8 MM X 40 CM HEMASHIELD GRAFT .BILATERAL FEMORAL ARTEERY ENDARTERECTOMY & BILATERAL PROFUNDAPLASTY;  Surgeon: Sherren Kernsharles E Fields, MD;  [facility];  Service: Vascular;  Laterality: Bilateral;     Allergies  Allergen Reactions  . Sulfa Antibiotics Itching and Rash      No family history on file.   Social History [name] reports that he quit smoking about 2 months ago. His smoking use included Cigarettes. He started smoking about 50 years ago. He has a 10 pack-year smoking history. He has never used smokeless tobacco. [name] reports that he does not drink alcohol.   Review of Systems CONSTITUTIONAL: No weight loss, fever, chills, weakness or fatigue.  HEENT: Eyes: No visual loss, blurred vision, double vision or yellow sclerae.No hearing loss, sneezing, congestion, runny nose or sore throat.  SKIN: No rash or itching.  CARDIOVASCULAR:  RESPIRATORY: No shortness  of breath, cough or sputum.  GASTROINTESTINAL: No anorexia, nausea, vomiting or diarrhea. No abdominal pain or blood.  GENITOURINARY: No burning on urination, no polyuria NEUROLOGICAL: No headache, dizziness, syncope, paralysis, ataxia, numbness or tingling in the extremities. No change in bowel or bladder control.  MUSCULOSKELETAL: No muscle, back pain, joint pain or stiffness.  LYMPHATICS: No enlarged nodes. No history of splenectomy.  PSYCHIATRIC: No history of depression or anxiety.  ENDOCRINOLOGIC: No reports of sweating, cold or heat intolerance. No polyuria or polydipsia.  Marland Kitchen   Physical Examination There  were no vitals filed for this visit. There were no vitals filed for this visit.  Gen: resting comfortably, no acute distress HEENT: no scleral icterus, pupils equal round and reactive, no palptable cervical adenopathy,  CV Resp: Clear to auscultation bilaterally GI: abdomen is soft, non-tender, non-distended, normal bowel sounds, no hepatosplenomegaly MSK: extremities are warm, no edema.  Skin: warm, no rash Neuro:  no focal deficits Psych: appropriate affect   Diagnostic Studies Jan 2017 Lexiscan MPI  There was no ST segment deviation noted during stress.  Defect 1: There is a medium defect of mild severity present in the mid inferoseptal, mid inferior and apical inferior location. This is due to soft tissue attenuation artifact. No myocardial ischemia or scar.  This is a low risk study.  Nuclear stress EF: 64%.     Assessment and Plan   1. Preoperative evaluation - patient being considered for high risk vascular surgery - cannot assess exercise capacity by history, he is limited chronic chronic leg pain - multiple CAD risk factors. We will obtain Lexiscan MPI to better risk stratify him prior to vascular surgery     Antoine Poche, M.D., F.A.C.C.

## 2015-07-22 NOTE — Telephone Encounter (Signed)
As per blue d/c 07-15-15 the following appointments were set up for pt.: 1. [facility], Dr. Sherryl BartersBudzyn on 07-29-15 at 7:45am; 2. Dr. Lajoyce Cornersuda 07-20-15 at 12:45, for (R) ankle possible osteo; 3. [facility], referral in workqueue.  Patient has been made aware of these appointments.

## 2015-07-24 ENCOUNTER — Encounter: Payer: Self-pay | Admitting: Podiatry

## 2015-07-24 ENCOUNTER — Ambulatory Visit (INDEPENDENT_AMBULATORY_CARE_PROVIDER_SITE_OTHER): Payer: Medicare Other | Admitting: Podiatry

## 2015-07-24 DIAGNOSIS — Q828 Other specified congenital malformations of skin: Secondary | ICD-10-CM | POA: Diagnosis not present

## 2015-07-24 DIAGNOSIS — B351 Tinea unguium: Secondary | ICD-10-CM | POA: Diagnosis not present

## 2015-07-24 DIAGNOSIS — M79676 Pain in unspecified toe(s): Secondary | ICD-10-CM | POA: Diagnosis not present

## 2015-07-24 NOTE — Progress Notes (Signed)
Patient ID: Martin DunkerRex A Johannesen, male   DOB: 07/25/1950, 65 y.o.   MRN: [id number] HPI  Complaint:  Visit Type: Patient returns to my office for continued preventative foot care services. Complaint: Patient states" my nails have grown long and thick and become painful to walk and wear shoes" Patient has been diagnosed with DVT right leg. . This patient  presents for preventative foot care services. No changes to ROS. Patient says he had vascular surgery since last visit.  Podiatric Exam: Vascular: dorsalis pedis and posterior tibial pulses are non-palpable. Capillary return is diminished. Cold feet noted.. Skin turgor WNL, bilateral swelling  Sensorium: Normal Semmes Weinstein monofilament test. Normal tactile sensation bilaterally.  Nail Exam: Pt has thick disfigured discolored nails with subungual debris noted bilateral entire nail hallux through fifth toenails Ulcer Exam: There is no evidence of ulcer or pre-ulcerative changes or infection. Orthopedic Exam: Muscle tone and strength are WNL. No limitations in general ROM. No crepitus or effusions noted. Foot type and digits show no abnormalities. Bony prominences are unremarkable. Ulcer lateral malleolus right foot. Skin: No Porokeratosis. No infection or ulcers. Multiple porokeratosis both feet including right heel and sub5th B/L  Diagnosis:  Onychomycosis, Pain in right toe, pain in left toes, Porokeratosis B/L  Treatment & Plan Procedures and Treatment: Consent by patient was obtained for treatment procedures. The patient understood the discussion of treatment and procedures well. All questions were answered thoroughly reviewed. Debridement of mycotic and hypertrophic toenails, 1 through 5 bilateral and clearing of subungual debris. No ulceration, no infection noted. Debride porokeratosis Return Visit-Office Procedure: Patient instructed to return to the office for a follow up visit 3 months for continued evaluation and treatment.   Helane GuntherGregory  Mariangel Ringley DPM

## 2015-08-27 ENCOUNTER — Encounter: Payer: Self-pay | Admitting: Cardiology

## 2015-08-27 ENCOUNTER — Ambulatory Visit (INDEPENDENT_AMBULATORY_CARE_PROVIDER_SITE_OTHER): Payer: Medicare Other | Admitting: Cardiology

## 2015-08-27 ENCOUNTER — Encounter: Payer: Self-pay | Admitting: *Deleted

## 2015-08-27 VITALS — BP 138/91 | HR 63 | Ht 66.0 in | Wt 159.4 lb

## 2015-08-27 DIAGNOSIS — I739 Peripheral vascular disease, unspecified: Secondary | ICD-10-CM

## 2015-08-27 DIAGNOSIS — Z86711 Personal history of pulmonary embolism: Secondary | ICD-10-CM | POA: Diagnosis not present

## 2015-08-27 DIAGNOSIS — E785 Hyperlipidemia, unspecified: Secondary | ICD-10-CM | POA: Diagnosis not present

## 2015-08-27 DIAGNOSIS — I779 Disorder of arteries and arterioles, unspecified: Secondary | ICD-10-CM

## 2015-08-27 NOTE — Progress Notes (Signed)
Patient ID: [name], male   DOB: 03/23/1951, 65 y.o.   MRN: [id number]     Clinical Summary Mr. [name] is a 65 y.o.male seen today for follow up of the following medical problems.   1. PAD - followed by vascular - patient with history of nonhealing right foot sore as well as claudication - s/p aortobifemroal bypass and bilateral endarterctomy Jan 2017 - foot sore had healed has healed, claudication resolved  2. 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 INR followed by pcp. - no recent bleeding troubles  3. Hyperlipidemia - reports recent labs with pcp - he is compliant with crestor  Past Medical History  Diagnosis Date  . Pancreatitis, alcoholic   . Hypertension     pt. denies htn,was treated 10 yrs, ago,not on any meds now  . 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 Sulfa Antibiotics Itching and Rash     Current Outpatient Prescriptions  Medication Sig Dispense Refill  . albuterol (PROVENTIL HFA) 108 (90 Base) MCG/ACT inhaler Inhale 2 puffs into the lungs every 6 (six) hours as needed for wheezing or shortness of breath.    Marland Kitchen. aspirin 81 MG chewable tablet Chew 81 mg by mouth daily.    . ciprofloxacin (CIPRO) 500 MG tablet Take 1 tablet (500 mg total) by mouth 2 (two) times daily. 14 tablet 0  . gabapentin (NEURONTIN) 100 MG capsule Take 100 mg by mouth 3 (three) times daily as needed. For pain    . oxyCODONE-acetaminophen (PERCOCET/ROXICET) 5-325 MG tablet Take 1-2 tablets by mouth every 4 (four) hours as needed for moderate pain. 30 tablet 0  . rosuvastatin (CRESTOR) 10 MG tablet Take 1 tablet (10 mg total) by mouth daily. 30 tablet 11  . traMADol (ULTRAM) 50 MG tablet Take 100 mg by mouth 2 (two) times daily. Pain    . warfarin (COUMADIN) 4 MG tablet Take 2 tablets (8 mg total) by mouth daily. Take 3 tablets (12 mg) today (05/19/15). Tomorrow 05/20/15, take your usual home dose of 8 mg (2 tablets). 30 tablet  0  . Wound Dressings (ALLANTOIN) gel Apply topically daily. Apply daily to right ankle wound and cover with dry gauze. 15 g 0   No current facility-administered medications for this visit.     Past Surgical History  Procedure Laterality Date  . Hip surgery      right  . Aorta - bilateral femoral artery bypass graft Bilateral 05/13/2015    Procedure: AORTO BIFEMORAL BYPASS GRAFT USING 14 X8 MM X 40 CM HEMASHIELD GRAFT .BILATERAL FEMORAL ARTEERY ENDARTERECTOMY & BILATERAL PROFUNDAPLASTY;  Surgeon: Martin Kernsharles E Fields, MD;  [facility];  Service: Vascular;  Laterality: Bilateral;     Allergies  Allergen Reactions  . Sulfa Antibiotics Itching and Rash      Family History He denies any family history of heart disease   Social History Mr. [name] reports that he quit smoking about 3 months ago. His smoking use included Cigarettes. He started smoking about 50 years ago. He has a 10 pack-year smoking history. He has never used smokeless tobacco. Mr. [name] reports that he does not drink alcohol.   Review of Systems CONSTITUTIONAL: No weight loss, fever, chills, weakness or fatigue.  HEENT: Eyes: No visual loss, blurred vision, double vision or yellow sclerae.No hearing loss, sneezing, congestion, runny nose or sore throat.  SKIN: No rash or itching.  CARDIOVASCULAR: no chest pain, no palpitations RESPIRATORY: No shortness of breath, cough or sputum.  GASTROINTESTINAL: No anorexia, nausea, vomiting or diarrhea. No abdominal pain or blood.  GENITOURINARY: No burning on urination, no polyuria NEUROLOGICAL: No headache, dizziness, syncope, paralysis, ataxia, numbness or tingling in the extremities. No change in bowel or bladder control.  MUSCULOSKELETAL: No muscle, back pain, joint pain or stiffness.  LYMPHATICS: No enlarged nodes. No history of splenectomy.  PSYCHIATRIC: No history of depression or anxiety.  ENDOCRINOLOGIC: No reports of sweating, cold or heat intolerance. No  polyuria or polydipsia.  Marland Kitchen   Physical Examination Filed Vitals:   08/27/15 1122  BP: 138/91  Pulse: 63   Filed Vitals:   08/27/15 1122  Height:  (1.676 m)  Weight: 159 lb 6.4 oz (72.303 kg)    Gen: resting comfortably, no acute distress HEENT: no scleral icterus, pupils equal round and reactive, no palptable cervical adenopathy,  CV: RRR, no m/r/g, no jvd Resp: Clear to auscultation bilaterally GI: abdomen is soft, non-tender, non-distended, normal bowel sounds, no hepatosplenomegaly MSK: extremities are warm, no edema.  Skin: warm, no rash Neuro:  no focal deficits Psych: appropriate affect   Diagnostic Studies Jan 2017 Lexiscan MPI  There was no ST segment deviation noted during stress.  Defect 1: There is a medium defect of mild severity present in the mid inferoseptal, mid inferior and apical inferior location. This is due to soft tissue attenuation artifact. No myocardial ischemia or scar.  This is a low risk study.  Nuclear stress EF: 64%.    Assessment and Plan   1.PAD - doing well after bypass surgery, continue to follow with vascular  2. History of PE - he is interested in possibly changing to NOAC, he would like to discuss with his pcp prior to changing.   3. Hyperlipidemia - reqeust most recent labs from pcp - continue current statin   F/u 1 year    [name], M.D.

## 2015-08-27 NOTE — Patient Instructions (Signed)
Your physician wants you to follow-up in: 1 year with Dr. Branch  You will receive a reminder letter in the mail two months in advance. If you don't receive a letter, please call our office to schedule the follow-up appointment.  Your physician recommends that you continue on your current medications as directed. Please refer to the Current Medication list given to you today.  Thank you for choosing [facility]!!   

## 2015-10-23 ENCOUNTER — Ambulatory Visit: Payer: Medicare Other | Admitting: Podiatry

## 2015-11-17 ENCOUNTER — Ambulatory Visit: Payer: Medicare Other | Admitting: Podiatry

## 2015-12-04 ENCOUNTER — Ambulatory Visit: Payer: Medicare Other | Admitting: Podiatry

## 2016-01-08 ENCOUNTER — Encounter: Payer: Self-pay | Admitting: Vascular Surgery

## 2016-01-14 ENCOUNTER — Ambulatory Visit (INDEPENDENT_AMBULATORY_CARE_PROVIDER_SITE_OTHER): Payer: Medicare Other | Admitting: Vascular Surgery

## 2016-01-14 ENCOUNTER — Ambulatory Visit (HOSPITAL_COMMUNITY)
Admission: RE | Admit: 2016-01-14 | Discharge: 2016-01-14 | Disposition: A | Discharge status: Home / Self Care | Payer: Medicare Other | Source: Ambulatory Visit | Attending: Vascular Surgery | Admitting: Vascular Surgery

## 2016-01-14 ENCOUNTER — Encounter: Payer: Self-pay | Admitting: Vascular Surgery

## 2016-01-14 VITALS — BP 130/76 | HR 53 | Temp 97.0°F | Resp 16 | Ht 66.0 in | Wt 164.0 lb

## 2016-01-14 DIAGNOSIS — I779 Disorder of arteries and arterioles, unspecified: Secondary | ICD-10-CM

## 2016-01-14 DIAGNOSIS — I739 Peripheral vascular disease, unspecified: Secondary | ICD-10-CM | POA: Diagnosis present

## 2016-01-14 NOTE — Progress Notes (Signed)
OFFICE NOTE      HPI:  This is a 65 y.o. male who is s/p aortobifemoral bypass, bilateral endarterectomy with profundoplasty on 05/13/15.  He was seen in our office on 05/26/2015 by our NP secondary to increased swelling in bilateral groins.  Dr. Arbie Cookey aspirated right groin seroma, about 30 cc aspirated, a few cc's sent for culture and sensitivity.  The culture was negative. He reports that these seromas have not returned. He is here for a follow up He is on Coumadin for DVT.        Past Medical History  Diagnosis Date  . Pancreatitis, alcoholic    . Hypertension        pt. denies htn,was treated 10 yrs, ago,not on any meds now  . Blood clot in vein 2011      clot in right leg ,broke off into right lung              Allergies    Allergen   Reactions    .   Sulfa Antibiotics   Itching and Rash              Current Outpatient Prescriptions    Medication   Sig   Dispense   Refill    .   albuterol (PROVENTIL HFA) 108 (90 Base) MCG/ACT inhaler   Inhale 2 puffs into the lungs every 6 (six) hours as needed for wheezing or shortness of breath.        Marland Kitchen   aspirin 81 MG chewable tablet   Chew 81 mg by mouth daily.        Marland Kitchen   gabapentin (NEURONTIN) 100 MG capsule   Take 100 mg by mouth 3 (three) times daily as needed. For pain        .   rosuvastatin (CRESTOR) 10 MG tablet   Take 1 tablet (10 mg total) by mouth daily.   30 tablet   11    .   traMADol (ULTRAM) 50 MG tablet   Take 100 mg by mouth 2 (two) times daily. Pain        .   warfarin (COUMADIN) 4 MG tablet   Take 2 tablets (8 mg total) by mouth daily. Take 3 tablets (12 mg) today (05/19/15). Tomorrow 05/20/15, take your usual home dose of 8 mg (2 tablets).   30 tablet   0    .   Wound Dressings (ALLANTOIN) gel   Apply topically daily. Apply daily to right ankle wound and cover with dry gauze.   15 g   0    .   oxyCODONE-acetaminophen (PERCOCET/ROXICET) 5-325 MG tablet   Take 1-2 tablets by mouth every 4 (four) hours as needed for  moderate pain. (Patient not taking: Reported on 05/28/2015)   30 tablet   0      No current facility-administered medications for this visit.         ROS:  See HPI   Physical Exam:    Vitals:   01/14/16 0904  BP: 130/76  Pulse: (!) 53  Resp: 16  Temp: 97 F (36.1 C)  TempSrc: Oral  SpO2: 97%  Weight: 164 lb (74.4 kg)  Height: 5\' 6"  (1.676 m)     Incision:  Abdominal incision is well healed  Bilateral groin incisions are well-healed no seroma 2+ femoral pulses   Distally his feet are warm to ouch active range of motion is intact.  he has known superficial femoral  artery occlusions    Extremities:  Right lateral malleolus wound completely healed  Data: Patient had bilateral ABIs which were 0.7 reviewed this study today and interpreted     Assessment/Plan:  This is a 65 y.o. male who is s/p:  aortobifemoral bypass, bilateral endarterectomy with profundoplasty     lateral malleolus ulcer completely healed   Patient will follow-up with us with repeat ABIs in one year   Fabienne Brunsharles Fields, MD [facility] Office: [phone] Pager: [phone]

## 2016-03-28 NOTE — Addendum Note (Signed)
Addended by: Burton ApleyPETTY, Donnivan Villena A on: 03/28/2016 03:23 PM   Modules accepted: Orders

## 2016-10-07 ENCOUNTER — Encounter: Payer: Self-pay | Admitting: Podiatry

## 2016-10-07 ENCOUNTER — Ambulatory Visit (INDEPENDENT_AMBULATORY_CARE_PROVIDER_SITE_OTHER): Payer: Medicare Other | Admitting: Podiatry

## 2016-10-07 DIAGNOSIS — M79676 Pain in unspecified toe(s): Secondary | ICD-10-CM

## 2016-10-07 DIAGNOSIS — B351 Tinea unguium: Secondary | ICD-10-CM | POA: Diagnosis not present

## 2016-10-07 NOTE — Progress Notes (Signed)
Patient ID: [name], male   DOB: 05/28/1950, 66 y.o.   MRN: [id number] HPI  Complaint:  Visit Type: Patient returns to my office for continued preventative foot care services. Complaint: Patient states" my nails have grown long and thick and become painful to walk and wear shoes" Patient says he is doing well since his vascular surgery.. This patient  presents for preventative foot care services. No changes to ROS  Podiatric Exam: Vascular: dorsalis pedis and posterior tibial pulses are now -palpable. Capillary return is diminished. . Skin turgor WNL,  Sensorium: Normal Semmes Weinstein monofilament test. Normal tactile sensation bilaterally.  Nail Exam: Pt has thick disfigured discolored nails with subungual debris noted bilateral entire nail hallux through fifth toenails Ulcer Exam: There is no evidence of ulcer or pre-ulcerative changes or infection. Orthopedic Exam: Muscle tone and strength are WNL. No limitations in general ROM. No crepitus or effusions noted. HAV  B/L. Skin: No Porokeratosis. No infection or ulcers.  Porokeratosis sub 1,5 B/L. Marland Kitchen.  Diagnosis:  Onychomycosis, Pain in right toe, pain in left toes, Porokeratosis B/L  Treatment & Plan Procedures and Treatment: Consent by patient was obtained for treatment procedures. The patient understood the discussion of treatment and procedures well. All questions were answered thoroughly reviewed. Debridement of mycotic and hypertrophic toenails, 1 through 5 bilateral and clearing of subungual debris. No ulceration, no infection noted. Debride porokeratosis Return Visit-Office Procedure: Patient instructed to return to the office for a follow up visit 3 months for continued evaluation and treatment.   Helane GuntherGregory Adelene Polivka DPM

## 2016-12-24 DIAGNOSIS — B9681 Helicobacter pylori [H. pylori] as the cause of diseases classified elsewhere: Secondary | ICD-10-CM

## 2016-12-24 DIAGNOSIS — K2970 Gastritis, unspecified, without bleeding: Secondary | ICD-10-CM

## 2016-12-24 HISTORY — DX: Helicobacter pylori (H. pylori) as the cause of diseases classified elsewhere: B96.81

## 2016-12-24 HISTORY — DX: Helicobacter pylori (H. pylori) as the cause of diseases classified elsewhere: K29.70

## 2017-01-06 ENCOUNTER — Ambulatory Visit: Payer: Medicare Other | Admitting: Podiatry

## 2017-01-13 ENCOUNTER — Emergency Department (HOSPITAL_COMMUNITY): Payer: Medicare Other

## 2017-01-13 ENCOUNTER — Emergency Department (HOSPITAL_COMMUNITY)
Admission: EM | Admit: 2017-01-13 | Discharge: 2017-01-13 | Disposition: A | Discharge status: Home / Self Care | Payer: Medicare Other | Attending: Emergency Medicine | Admitting: Emergency Medicine

## 2017-01-13 ENCOUNTER — Encounter (HOSPITAL_COMMUNITY): Payer: Self-pay

## 2017-01-13 DIAGNOSIS — Z7982 Long term (current) use of aspirin: Secondary | ICD-10-CM | POA: Diagnosis not present

## 2017-01-13 DIAGNOSIS — W228XXA Striking against or struck by other objects, initial encounter: Secondary | ICD-10-CM | POA: Diagnosis not present

## 2017-01-13 DIAGNOSIS — Z87891 Personal history of nicotine dependence: Secondary | ICD-10-CM | POA: Insufficient documentation

## 2017-01-13 DIAGNOSIS — I10 Essential (primary) hypertension: Secondary | ICD-10-CM | POA: Insufficient documentation

## 2017-01-13 DIAGNOSIS — Z7901 Long term (current) use of anticoagulants: Secondary | ICD-10-CM | POA: Diagnosis not present

## 2017-01-13 DIAGNOSIS — Z79899 Other long term (current) drug therapy: Secondary | ICD-10-CM | POA: Insufficient documentation

## 2017-01-13 DIAGNOSIS — Y93H9 Activity, other involving exterior property and land maintenance, building and construction: Secondary | ICD-10-CM | POA: Diagnosis not present

## 2017-01-13 DIAGNOSIS — S9002XA Contusion of left ankle, initial encounter: Secondary | ICD-10-CM | POA: Diagnosis not present

## 2017-01-13 DIAGNOSIS — Y929 Unspecified place or not applicable: Secondary | ICD-10-CM | POA: Diagnosis not present

## 2017-01-13 DIAGNOSIS — Y998 Other external cause status: Secondary | ICD-10-CM | POA: Insufficient documentation

## 2017-01-13 DIAGNOSIS — M25572 Pain in left ankle and joints of left foot: Secondary | ICD-10-CM | POA: Diagnosis present

## 2017-01-13 NOTE — Discharge Instructions (Signed)
Elevate and apply ice packs on/off to your ankle.  Wear the brace for weight bearing as needed.  Follow-up with your doctor or the orthopedic doctor listed if not improving.  Return here for any worsening symptoms

## 2017-01-13 NOTE — ED Provider Notes (Signed)
[facility]-EMER[facility]ENCY DEPT Provider Note   CSN: [id number] Arrival date & time: 01/13/17  2020     History   Chief Complaint Chief Complaint  Patient presents with  . Ankle Pain    HPI [name] is a 66 y.o. male.  HPI   [name] is a 66 y.o. male with history of right-sided DVT and currently taking Coumadin, presents to the Emergency Department complaining of pain and swelling to his left ankle and foot for one day. He states pain began after his ankle was accidentally struck with a logging chain.  He complains of bruising to the area. Pain to the ankle is worse with weightbearing. He denies numbness of the extremity, open wounds, and erythema.  He has not tried any therapies prior to arrival.     Past Medical History:  Diagnosis Date  . Blood clot in vein 2011   clot in right leg ,broke off into right lung  . Hypertension    pt. denies htn,was treated 10 yrs, ago,not on any meds now  . Pancreatitis, alcoholic     Patient Active Problem List   Diagnosis Date Noted  . Pressure ulcer 05/14/2015  . 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 Colon polyp 04/30/2015  . PAD (peripheral artery disease) (HCC) 04/30/2015  . DVT of leg (deep venous thrombosis) (HCC) 02/18/2011    Past Surgical History:  Procedure Laterality Date  . AORTA - BILATERAL FEMORAL ARTERY BYPASS GRAFT Bilateral 05/13/2015   Procedure: AORTO BIFEMORAL BYPASS GRAFT USING 14 X8 MM X 40 CM HEMASHIELD GRAFT .BILATERAL FEMORAL ARTEERY ENDARTERECTOMY & BILATERAL PROFUNDAPLASTY;  Surgeon: Sherren Kerns, MD;  Location: [facility] OR;  Service: Vascular;  Laterality: Bilateral;  . HIP SURGERY     right       Home Medications    Prior to Admission medications   Medication Sig Start Date End Date Taking? Authorizing Provider  warfarin (COUMADIN) 10 MG tablet Take 1 tab as needed 01/09/17  Yes [provider]  albuterol (PROVENTIL HFA) 108 (90 Base) MCG/ACT inhaler Inhale 2 puffs into the lungs  every 6 (six) hours as needed for wheezing or shortness of breath.    [provider]  aspirin 81 MG chewable tablet Chew 81 mg by mouth daily.    [provider]  gabapentin (NEURONTIN) 100 MG capsule Take 100 mg by mouth 3 (three) times daily as needed. For pain    [provider]  gabapentin (NEURONTIN) 300 MG capsule Take 300 mg by mouth at bedtime. 12/30/16   [provider]  rosuvastatin (CRESTOR) 10 MG tablet Take 1 tablet (10 mg total) by mouth daily. 05/19/15   Raymond Gurney, PA-C  tamsulosin (FLOMAX) 0.4 MG CAPS capsule Take 0.4 mg by mouth daily.  07/21/15   [provider]  traMADol (ULTRAM) 50 MG tablet Take 100 mg by mouth 2 (two) times daily. Pain    [provider]  warfarin (COUMADIN) 4 MG tablet Take 2 tablets (8 mg total) by mouth daily. Take 3 tablets (12 mg) today (05/19/15). Tomorrow 05/20/15, take your usual home dose of 8 mg (2 tablets). 05/19/15   Raymond Gurney, PA-C    Family History No family history on file.  Social History Social History  Substance Use Topics  . Smoking status: Former Smoker    Packs/day: 0.25    Years: 40.00    Types: Cigarettes    Start date: 09/26/1964    Quit date: 05/12/2015  .  Smokeless tobacco: Never Used  . Alcohol use No     Allergies   Sulfa antibiotics   Review of Systems Review of Systems  Constitutional: Negative for chills and fever.  Respiratory: Negative for shortness of breath.   Cardiovascular: Negative for chest pain.  Musculoskeletal: Positive for arthralgias (left ankle pain and swelling) and joint swelling. Negative for neck pain.  Skin: Negative for color change and wound.  Neurological: Negative for weakness and numbness.  All other systems reviewed and are negative.    Physical Exam Updated Vital Signs BP 113/65   Pulse 78   Temp 98.6 F (37 C) (Oral)   Resp 18   Ht  (1.676 m)   Wt 68.5 kg (151 lb)   SpO2 99%   BMI 24.37 kg/m    Physical Exam  Constitutional: He is oriented to person, place, and time. He appears well-developed and well-nourished. No distress.  HENT:  Head: Atraumatic.  Cardiovascular: Normal rate, regular rhythm and intact distal pulses.   Pulmonary/Chest: Effort normal and breath sounds normal. No respiratory distress. He exhibits no tenderness.  Musculoskeletal: He exhibits tenderness.  Ttp, mild edema and bruising to the proximal left ankle.  No open wounds.  Calf is soft and non-tender  Neurological: He is alert and oriented to person, place, and time. No sensory deficit.  Skin: Skin is warm. Capillary refill takes less than 2 seconds.  Nursing note and vitals reviewed.    ED Treatments / Results  Labs (all labs ordered are listed, but only abnormal results are displayed) Labs Reviewed - No data to display  EKG  EKG Interpretation None       Radiology Dg Ankle Complete Left  Result Date: 01/13/2017 CLINICAL DATA:  Left foot and ankle pain after blunt trauma yesterday. EXAM: LEFT ANKLE COMPLETE - 3+ VIEW COMPARISON:  None. FINDINGS: There is no evidence of fracture, dislocation, or joint effusion. There is no evidence of arthropathy or other focal bone abnormality. Soft tissues are unremarkable. IMPRESSION: Negative. Electronically Signed   By: Elberta Fortis M.D.   On: 01/13/2017 21:19   Dg Foot Complete Left  Result Date: 01/13/2017 CLINICAL DATA:  Blunt trauma to top of left foot and ankle yesterday. EXAM: LEFT FOOT - COMPLETE 3+ VIEW COMPARISON:  None. FINDINGS: Health valgus deformity with mild degenerative change of the first MTP joint. Old distal second and third metatarsal fractures. No acute fracture or dislocation. IMPRESSION: No acute fracture. Electronically Signed   By: Elberta Fortis M.D.   On: 01/13/2017 21:20    Procedures Procedures (including critical care time)  Medications Ordered in ED Medications - No data to display   Initial Impression / Assessment and  Plan / ED Course  I have reviewed the triage vital signs and the nursing notes.  Pertinent labs & imaging results that were available during my care of the patient were reviewed by me and considered in my medical decision making (see chart for details).     Pt also seen by Dr. Deretha Emory.    XRs neg for fx, NV intact.  Pt currently taking Coumadin secondary to previous DVT, diagnosed in 2012.  Compartments are soft.  Posterior lower leg is soft and non-tender.    ASO applied for comfort and support.  Agrees to RICE therapy and close orthopedic f/u, ER return precautions discussed.   Final Clinical Impressions(s) / ED Diagnoses   Final diagnoses:  Contusion of left ankle, initial encounter    New Prescriptions New  Prescriptions   No medications on file     Pauline Aus, Cordelia Poche 01/13/17 2228    Vanetta Mulders, MD 01/18/17 [phone]

## 2017-01-13 NOTE — ED Triage Notes (Signed)
Pt reports accidentally hit left ankle with a chain.  Bruising and swelling noted.

## 2017-01-13 NOTE — ED Notes (Signed)
ED Provider at bedside to discuss disposition at this time 

## 2017-01-13 NOTE — ED Notes (Signed)
Pedal pulse present in left foot, foot warm and dry.

## 2017-01-13 NOTE — ED Provider Notes (Signed)
Medical screening examination/treatment/procedure(s) were conducted as a shared visit with non-physician practitioner(s) and myself.  I personally evaluated the patient during the encounter.   EKG Interpretation None       Results for orders placed or performed in visit on 06/11/15  Urinalysis, Routine w reflex microscopic (not at [facility])  Result Value Ref Range   Color, Urine YELLOW YELLOW   APPearance CLEAR CLEAR   Specific Gravity, Urine 1.008 1.001 - 1.035   pH 6.0 5.0 - 8.0   Glucose, UA NEGATIVE NEGATIVE   Bilirubin Urine NEGATIVE NEGATIVE   Ketones, ur NEGATIVE NEGATIVE   Hgb urine dipstick TRACE (A) NEGATIVE   Protein, ur NEGATIVE NEGATIVE   Nitrite NEGATIVE NEGATIVE   Leukocytes, UA NEGATIVE NEGATIVE  Urine Microscopic  Result Value Ref Range   WBC, UA NONE SEEN <=5 WBC/HPF   RBC / HPF NONE SEEN <=2 RBC/HPF   Squamous Epithelial / LPF NONE SEEN <=5 HPF   Bacteria, UA NONE SEEN NONE SEEN HPF   Crystals NONE SEEN NONE SEEN HPF   Casts NONE SEEN NONE SEEN LPF   Yeast NONE SEEN NONE SEEN HPF   Dg Ankle Complete Left  Result Date: 01/13/2017 CLINICAL DATA:  Left foot and ankle pain after blunt trauma yesterday. EXAM: LEFT ANKLE COMPLETE - 3+ VIEW COMPARISON:  None. FINDINGS: There is no evidence of fracture, dislocation, or joint effusion. There is no evidence of arthropathy or other focal bone abnormality. Soft tissues are unremarkable. IMPRESSION: Negative. Electronically Signed   By: Elberta Fortis M.D.   On: 01/13/2017 21:19   Dg Foot Complete Left  Result Date: 01/13/2017 CLINICAL DATA:  Blunt trauma to top of left foot and ankle yesterday. EXAM: LEFT FOOT - COMPLETE 3+ VIEW COMPARISON:  None. FINDINGS: Health valgus deformity with mild degenerative change of the first MTP joint. Old distal second and third metatarsal fractures. No acute fracture or dislocation. IMPRESSION: No acute fracture. Electronically Signed   By: Elberta Fortis M.D.   On: 01/13/2017 21:20    The  patient seen by me along with the physician assistant. Patient with complaint of left ankle pain and swelling following being hit by a chain yesterday. Patient has localized swelling just to the ankle with an anterior bruise measuring about 4 cm. Has good blood flow into the foot. No proximal swelling to the calf or thigh at all. Do not feel that this is consistent with a deep vein thrombosis. Loistine Chance this is local trauma. X-rays of the ankle and foot without any significant bony abnormalities. Patient is on Coumadin but no evidence of any significant bleeding either. Patient has had a history ofDVT in the past. As well as PE. No evidence of any significant respiratory difficulties pulse ox is 100%. Not tachycardic. Not worry about pulmonary embolus either.   Vanetta Mulders, MD 01/13/17 [id number]

## 2017-01-19 ENCOUNTER — Ambulatory Visit (INDEPENDENT_AMBULATORY_CARE_PROVIDER_SITE_OTHER)
Admission: RE | Admit: 2017-01-19 | Discharge: 2017-01-19 | Disposition: A | Discharge status: Home / Self Care | Payer: Medicare Other | Source: Ambulatory Visit | Attending: Vascular Surgery | Admitting: Vascular Surgery

## 2017-01-19 ENCOUNTER — Encounter: Payer: Self-pay | Admitting: Family

## 2017-01-19 ENCOUNTER — Ambulatory Visit (INDEPENDENT_AMBULATORY_CARE_PROVIDER_SITE_OTHER): Payer: Medicare Other | Admitting: Family

## 2017-01-19 VITALS — BP 100/62 | HR 70 | Temp 98.2°F | Resp 18 | Ht 66.0 in | Wt 143.0 lb

## 2017-01-19 DIAGNOSIS — I739 Peripheral vascular disease, unspecified: Secondary | ICD-10-CM

## 2017-01-19 DIAGNOSIS — I779 Disorder of arteries and arterioles, unspecified: Secondary | ICD-10-CM

## 2017-01-19 DIAGNOSIS — F17200 Nicotine dependence, unspecified, uncomplicated: Secondary | ICD-10-CM

## 2017-01-19 DIAGNOSIS — Z95828 Presence of other vascular implants and grafts: Secondary | ICD-10-CM

## 2017-01-19 DIAGNOSIS — R9439 Abnormal result of other cardiovascular function study: Secondary | ICD-10-CM | POA: Insufficient documentation

## 2017-01-19 DIAGNOSIS — K2901 Acute gastritis with bleeding: Secondary | ICD-10-CM | POA: Diagnosis not present

## 2017-01-19 DIAGNOSIS — K921 Melena: Secondary | ICD-10-CM | POA: Diagnosis not present

## 2017-01-19 NOTE — Progress Notes (Signed)
[facility] [place]   CC: Follow up peripheral artery occlusive disease  History of Present Illness [name] is a 66 y.o. male who is s/p aortobifemoral bypass, bilateral endarterectomy with profundoplasty on 05/13/15.He was seen in our office on 05/26/2015 by our NP secondary to increased swelling in bilateral groins.Dr. Arbie Cookey aspirated right groin seroma, about 30 cc aspirated, a few cc's sent for culture and sensitivity.The culture was negative. He reports that these seromas have not returned.  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   Dr. Darrick Penna last evaluated pt on 01-14-16. At that time bilateral ABIs were 0.7. Lateral malleolus ulcer completely healed. Dr. Darrick Penna advised pt to follow-up with [place] with repeat ABIs in one year.  He is here for a follow up.   He dropped a heavy chain on his left ankle about 2 weeks ago, seen in the ED on 01-13-17, states he had a bone bruise. His walking was not limited with no claudication until this event.   He saw his PCP 1-2 weeks ago. He states he told his PCP that he had been running a fever.  He has been losing weight for about a month, lost his appetitive after a nose bleed, feels like he has chest congestion, has trouble breathing. He denies post prandial abdominal pain. He also reports sweating all day. He states he will be seeing his PCP this afternoon; I advised him to let his PCP know this.    Pt Diabetic: No Pt smoker: resumed smoking, 2 cigarettes/day, started in 1966  Pt meds include: Statin :Yes Betablocker: No ASA: Yes Other anticoagulants/antiplatelets: Warfarin  Past Medical History:  Diagnosis Date  . Blood clot in vein 2011   clot in right leg ,broke off into right lung  . Hypertension    pt. denies htn,was treated 10 yrs, ago,not on any meds now  . Pancreatitis, alcoholic     Social History Social History  Substance Use Topics  . Smoking status:  Former Smoker    Packs/day: 0.25    Years: 40.00    Types: Cigarettes    Start date: 09/26/1964    Quit date: 05/12/2015  . Smokeless tobacco: Never Used  . Alcohol use No    Family History Family History  Problem Relation Age of Onset  . Heart disease Sister   . Heart disease Brother     Past Surgical History:  Procedure Laterality Date  . AORTA - BILATERAL FEMORAL ARTERY BYPASS GRAFT Bilateral 05/13/2015   Procedure: AORTO BIFEMORAL BYPASS GRAFT USING 14 X8 MM X 40 CM HEMASHIELD GRAFT .BILATERAL FEMORAL ARTEERY ENDARTERECTOMY & BILATERAL PROFUNDAPLASTY;  Surgeon: Sherren Kerns, MD;  Location: [facility] OR;  Service: Vascular;  Laterality: Bilateral;  . HIP SURGERY     right    Allergies  Allergen Reactions  . Sulfa Antibiotics Itching and Rash    Current Outpatient Prescriptions  Medication Sig Dispense Refill  . albuterol (PROVENTIL HFA) 108 (90 Base) MCG/ACT inhaler Inhale 2 puffs into the lungs every 6 (six) hours as needed for wheezing or shortness of breath.    Marland Kitchen aspirin 81 MG chewable tablet Chew 81 mg by mouth daily.    Marland Kitchen gabapentin (NEURONTIN) 100 MG capsule Take 100 mg by mouth 3 (three) times daily as needed. For pain    . gabapentin (NEURONTIN) 300 MG capsule Take 300 mg by mouth at bedtime.  3  . rosuvastatin (CRESTOR) 10 MG tablet Take 1  tablet (10 mg total) by mouth daily. 30 tablet 11  . tamsulosin (FLOMAX) 0.4 MG CAPS capsule Take 0.4 mg by mouth daily.     . traMADol (ULTRAM) 50 MG tablet Take 100 mg by mouth 2 (two) times daily. Pain    . warfarin (COUMADIN) 10 MG tablet Take 1 tab as needed    . warfarin (COUMADIN) 4 MG tablet Take 2 tablets (8 mg total) by mouth daily. Take 3 tablets (12 mg) today (05/19/15). Tomorrow 05/20/15, take your usual home dose of 8 mg (2 tablets). 30 tablet 0   No current facility-administered medications for this visit.     ROS: See HPI for pertinent positives and negatives.   Physical Examination  Vitals:   01/19/17 1433  01/19/17 1438  BP: (!) 97/59 100/62  Pulse: 70   Resp: 18   Temp: 98.2 F (36.8 C)   TempSrc: Oral   SpO2: 100%   Weight: 143 lb (64.9 kg)   Height:  (1.676 m)    Body mass index is 23.08 kg/m.  General: A&O x 3, WDWN male. Gait: normal Eyes: PERRLA. Pulmonary: Respirations are non labored, CTAB, good air movement, + occasional moist cough Cardiac: regular Rhythm, no detected murmur.         Carotid Bruits Right Left   Negative Negative   Radial pulses are 1+ palpable bilaterally   Adominal aortic pulse is not palpable                         VASCULAR EXAM: Extremities without ischemic changes, without Gangrene; without open wounds. Mild darkening of skin and mild swelling at anterior aspect left ankle, moderate tenderness to touch.                                                                                                            LE Pulses Right Left       FEMORAL  2+ palpable  2+ palpable        POPLITEAL  not palpable   not palpable       POSTERIOR TIBIAL  not palpable   not palpable        DORSALIS PEDIS      ANTERIOR TIBIAL not palpable  not palpable    Abdomen: soft, NT, no palpable masses. Skin: no rashes, no ulcers noted. Musculoskeletal: no muscle wasting or atrophy.  Neurologic: A&O X 3; Appropriate Affect ; SENSATION: normal; MOTOR FUNCTION:  moving all extremities equally, motor strength 4/5 throughout. Speech is fluent/normal. CN 2-12 intact.    ASSESSMENT: [name] is a 66 y.o. male who  s/p aortobifemoral bypass, bilateral endarterectomy with profundoplasty on 05/13/15.  He did not seem to have any claudication sx's before he dropped a heavy chain on his right ankle, which then limited his walking due to pain and soft tissue injury.  There are no signs of ischemia in his feet or legs.  Right lateral malleolus ulcer completely healed.  DATA  ABI (Date: 01/19/2017):  R:  ABI: 0.69 (was 0.68 on 01-14-16),   PT: mono  DP:  mono  TBI:  0.45  L:   ABI: 0.79 (was 0.76),   PT: mono  DP: mono  TBI: 0.42 Stable bilateral ABI with moderate disease, all monophasic waveforms.  PLAN:  The patient was counseled re smoking cessation and given several free resources re smoking cessation.   Daily seated leg exercises while his left ankle heals from soft tissue injury.  Then gradually resume graduated walking program.  Based on the patient's vascular studies and examination, pt will return to clinic in 1 year for ABI's. I advised him and his wife to notify us if he develops concerns re the circulation in his feet or legs.   I discussed in depth with the patient the nature of atherosclerosis, and emphasized the importance of maximal medical management including strict control of blood pressure, blood glucose, and lipid levels, obtaining regular exercise, and cessation of smoking.  The patient is aware that without maximal medical management the underlying atherosclerotic disease process will progress, limiting the benefit of any interventions.  The patient was given information about PAD including signs, symptoms, treatment, what symptoms should prompt the patient to seek immediate medical care, and risk reduction measures to take.  Charisse March, RN, MSN, FNP-C [facility] [place] Phone: [phone]  Clinic MD: Darrick Penna  01/19/17 2:43 PM

## 2017-01-19 NOTE — Patient Instructions (Addendum)
Steps to Quit Smoking Smoking tobacco can be bad for your health. It can also affect almost every organ in your body. Smoking puts you and people around you at risk for many serious long-lasting (chronic) diseases. Quitting smoking is hard, but it is one of the best things that you can do for your health. It is never too late to quit. What are the benefits of quitting smoking? When you quit smoking, you lower your risk for getting serious diseases and conditions. They can include:  Lung cancer or lung disease.  Heart disease.  Stroke.  Heart attack.  Not being able to have children (infertility).  Weak bones (osteoporosis) and broken bones (fractures).  If you have coughing, wheezing, and shortness of breath, those symptoms may get better when you quit. You may also get sick less often. If you are pregnant, quitting smoking can help to lower your chances of having a baby of low birth weight. What can I do to help me quit smoking? Talk with your doctor about what can help you quit smoking. Some things you can do (strategies) include:  Quitting smoking totally, instead of slowly cutting back how much you smoke over a period of time.  Going to in-person counseling. You are more likely to quit if you go to many counseling sessions.  Using resources and support systems, such as: ? Online chats with a counselor. ? Phone quitlines. ? Printed self-help materials. ? Support groups or group counseling. ? Text messaging programs. ? Mobile phone apps or applications.  Taking medicines. Some of these medicines may have nicotine in them. If you are pregnant or breastfeeding, do not take any medicines to quit smoking unless your doctor says it is okay. Talk with your doctor about counseling or other things that can help you.  Talk with your doctor about using more than one strategy at the same time, such as taking medicines while you are also going to in-person counseling. This can help make  quitting easier. What things can I do to make it easier to quit? Quitting smoking might feel very hard at first, but there is a lot that you can do to make it easier. Take these steps:  Talk to your family and friends. Ask them to support and encourage you.  Call phone quitlines, reach out to support groups, or work with a counselor.  Ask people who smoke to not smoke around you.  Avoid places that make you want (trigger) to smoke, such as: ? Bars. ? Parties. ? Smoke-break areas at work.  Spend time with people who do not smoke.  Lower the stress in your life. Stress can make you want to smoke. Try these things to help your stress: ? Getting regular exercise. ? Deep-breathing exercises. ? Yoga. ? Meditating. ? Doing a body scan. To do this, close your eyes, focus on one area of your body at a time from head to toe, and notice which parts of your body are tense. Try to relax the muscles in those areas.  Download or buy apps on your mobile phone or tablet that can help you stick to your quit plan. There are many free apps, such as QuitGuide from the CDC (Centers for Disease Control and Prevention). You can find more support from smokefree.gov and other websites.  This information is not intended to replace advice given to you by your health care provider. Make sure you discuss any questions you have with your health care provider. Document Released: 02/05/2009 Document   Revised: 12/08/2015 Document Reviewed: 08/26/2014 Elsevier Interactive Patient Education  2018 [employer] Inc.     Peripheral Vascular Disease Peripheral vascular disease (PVD) is a disease of the blood vessels that are not part of your heart and brain. A simple term for PVD is poor circulation. In most cases, PVD narrows the blood vessels that carry blood from your heart to the rest of your body. This can result in a decreased supply of blood to your arms, legs, and internal organs, like your stomach or kidneys.  However, it most often affects a person's lower legs and feet. There are two types of PVD.  Organic PVD. This is the more common type. It is caused by damage to the structure of blood vessels.  Functional PVD. This is caused by conditions that make blood vessels contract and tighten (spasm).  Without treatment, PVD tends to get worse over time. PVD can also lead to acute ischemic limb. This is when an arm or limb suddenly has trouble getting enough blood. This is a medical emergency. Follow these instructions at home:  Take medicines only as told by your doctor.  Do not use any tobacco products, including cigarettes, chewing tobacco, or electronic cigarettes. If you need help quitting, ask your doctor.  Lose weight if you are overweight, and maintain a healthy weight as told by your doctor.  Eat a diet that is low in fat and cholesterol. If you need help, ask your doctor.  Exercise regularly. Ask your doctor for some good activities for you.  Take good care of your feet. ? Wear comfortable shoes that fit well. ? Check your feet often for any cuts or sores. Contact a doctor if:  You have cramps in your legs while walking.  You have leg pain when you are at rest.  You have coldness in a leg or foot.  Your skin changes.  You are unable to get or have an erection (erectile dysfunction).  You have cuts or sores on your feet that are not healing. Get help right away if:  Your arm or leg turns cold and blue.  Your arms or legs become red, warm, swollen, painful, or numb.  You have chest pain or trouble breathing.  You suddenly have weakness in your face, arm, or leg.  You become very confused or you cannot speak.  You suddenly have a very bad headache.  You suddenly cannot see. This information is not intended to replace advice given to you by your health care provider. Make sure you discuss any questions you have with your health care provider. Document Released:  07/06/2009 Document Revised: 09/17/2015 Document Reviewed: 09/19/2013 Elsevier Interactive Patient Education  2017 Elsevier Inc.  

## 2017-01-20 ENCOUNTER — Inpatient Hospital Stay (HOSPITAL_COMMUNITY)
Admission: EM | Admit: 2017-01-20 | Discharge: 2017-01-23 | DRG: 377 | Disposition: A | Discharge status: Home Health | Payer: Medicare Other | Attending: Family Medicine | Admitting: Family Medicine

## 2017-01-20 ENCOUNTER — Encounter (HOSPITAL_COMMUNITY): Payer: Self-pay | Admitting: *Deleted

## 2017-01-20 ENCOUNTER — Emergency Department (HOSPITAL_COMMUNITY): Payer: Medicare Other

## 2017-01-20 DIAGNOSIS — Z87891 Personal history of nicotine dependence: Secondary | ICD-10-CM

## 2017-01-20 DIAGNOSIS — I824Y9 Acute embolism and thrombosis of unspecified deep veins of unspecified proximal lower extremity: Secondary | ICD-10-CM | POA: Diagnosis not present

## 2017-01-20 DIAGNOSIS — Z86718 Personal history of other venous thrombosis and embolism: Secondary | ICD-10-CM

## 2017-01-20 DIAGNOSIS — K922 Gastrointestinal hemorrhage, unspecified: Secondary | ICD-10-CM | POA: Diagnosis not present

## 2017-01-20 DIAGNOSIS — R791 Abnormal coagulation profile: Secondary | ICD-10-CM | POA: Diagnosis present

## 2017-01-20 DIAGNOSIS — Z8249 Family history of ischemic heart disease and other diseases of the circulatory system: Secondary | ICD-10-CM

## 2017-01-20 DIAGNOSIS — I959 Hypotension, unspecified: Secondary | ICD-10-CM

## 2017-01-20 DIAGNOSIS — K5710 Diverticulosis of small intestine without perforation or abscess without bleeding: Secondary | ICD-10-CM | POA: Diagnosis present

## 2017-01-20 DIAGNOSIS — I82409 Acute embolism and thrombosis of unspecified deep veins of unspecified lower extremity: Secondary | ICD-10-CM | POA: Diagnosis present

## 2017-01-20 DIAGNOSIS — K921 Melena: Secondary | ICD-10-CM

## 2017-01-20 DIAGNOSIS — D62 Acute posthemorrhagic anemia: Secondary | ICD-10-CM | POA: Diagnosis present

## 2017-01-20 DIAGNOSIS — R031 Nonspecific low blood-pressure reading: Secondary | ICD-10-CM | POA: Diagnosis present

## 2017-01-20 DIAGNOSIS — Z7982 Long term (current) use of aspirin: Secondary | ICD-10-CM

## 2017-01-20 DIAGNOSIS — K2970 Gastritis, unspecified, without bleeding: Secondary | ICD-10-CM | POA: Diagnosis not present

## 2017-01-20 DIAGNOSIS — A419 Sepsis, unspecified organism: Secondary | ICD-10-CM | POA: Diagnosis present

## 2017-01-20 DIAGNOSIS — Z7901 Long term (current) use of anticoagulants: Secondary | ICD-10-CM

## 2017-01-20 DIAGNOSIS — J189 Pneumonia, unspecified organism: Secondary | ICD-10-CM | POA: Diagnosis present

## 2017-01-20 DIAGNOSIS — I2510 Atherosclerotic heart disease of native coronary artery without angina pectoris: Secondary | ICD-10-CM | POA: Diagnosis present

## 2017-01-20 DIAGNOSIS — K2901 Acute gastritis with bleeding: Secondary | ICD-10-CM | POA: Diagnosis present

## 2017-01-20 DIAGNOSIS — I739 Peripheral vascular disease, unspecified: Secondary | ICD-10-CM | POA: Diagnosis present

## 2017-01-20 DIAGNOSIS — D649 Anemia, unspecified: Secondary | ICD-10-CM

## 2017-01-20 DIAGNOSIS — J181 Lobar pneumonia, unspecified organism: Secondary | ICD-10-CM | POA: Diagnosis not present

## 2017-01-20 LAB — BASIC METABOLIC PANEL
Anion gap: 7 (ref 5–15)
BUN: 20 mg/dL (ref 6–20)
CALCIUM: 8.5 mg/dL — AB (ref 8.9–10.3)
CO2: 24 mmol/L (ref 22–32)
CREATININE: 0.75 mg/dL (ref 0.61–1.24)
Chloride: 104 mmol/L (ref 101–111)
Glucose, Bld: 102 mg/dL — ABNORMAL HIGH (ref 65–99)
Potassium: 3.9 mmol/L (ref 3.5–5.1)
SODIUM: 135 mmol/L (ref 135–145)

## 2017-01-20 LAB — CBC WITH DIFFERENTIAL/PLATELET
BASOS ABS: 0 10*3/uL (ref 0.0–0.1)
Basophils Relative: 0 %
EOS ABS: 0 10*3/uL (ref 0.0–0.7)
Eosinophils Relative: 1 %
HCT: 20.1 % — ABNORMAL LOW (ref 39.0–52.0)
HEMOGLOBIN: 6.1 g/dL — AB (ref 13.0–17.0)
LYMPHS ABS: 1.5 10*3/uL (ref 0.7–4.0)
Lymphocytes Relative: 37 %
MCH: 27.5 pg (ref 26.0–34.0)
MCHC: 30.3 g/dL (ref 30.0–36.0)
MCV: 90.5 fL (ref 78.0–100.0)
MONO ABS: 0.4 10*3/uL (ref 0.1–1.0)
MONOS PCT: 9 %
Neutro Abs: 2.2 10*3/uL (ref 1.7–7.7)
Neutrophils Relative %: 53 %
PLATELETS: ADEQUATE 10*3/uL (ref 150–400)
RBC: 2.22 MIL/uL — AB (ref 4.22–5.81)
RDW: 22.8 % — AB (ref 11.5–15.5)
Smear Review: ADEQUATE
WBC: 4.1 10*3/uL (ref 4.0–10.5)

## 2017-01-20 LAB — PREPARE RBC (CROSSMATCH)

## 2017-01-20 LAB — I-STAT CG4 LACTIC ACID, ED: Lactic Acid, Venous: 0.85 mmol/L (ref 0.5–1.9)

## 2017-01-20 LAB — PROTIME-INR
INR: 6.77 — AB
PROTHROMBIN TIME: 58.4 s — AB (ref 11.4–15.2)

## 2017-01-20 LAB — PROCALCITONIN: Procalcitonin: 7.48 ng/mL

## 2017-01-20 LAB — POC OCCULT BLOOD, ED: Fecal Occult Bld: POSITIVE — AB

## 2017-01-20 LAB — ABO/RH: ABO/RH(D): B POS

## 2017-01-20 MED ORDER — TAMSULOSIN HCL 0.4 MG PO CAPS
0.4000 mg | ORAL_CAPSULE | Freq: Every day | ORAL | Status: DC
  Administered 2017-01-21 – 2017-01-23 (×3): 0.4 mg via ORAL
  Filled 2017-01-20 (×3): qty 1

## 2017-01-20 MED ORDER — DEXTROSE 5 % IV SOLN
500.0000 mg | Freq: Once | INTRAVENOUS | Status: AC
  Administered 2017-01-20: 500 mg via INTRAVENOUS
  Filled 2017-01-20 (×2): qty 500

## 2017-01-20 MED ORDER — GABAPENTIN 100 MG PO CAPS: 100.0000 mg | ORAL_CAPSULE | Freq: Three times a day (TID) | ORAL | Status: DC | PRN

## 2017-01-20 MED ORDER — PANTOPRAZOLE SODIUM 40 MG IV SOLR
40.0000 mg | Freq: Two times a day (BID) | INTRAVENOUS | Status: DC
Start: 2017-01-20 — End: 2017-01-21
  Administered 2017-01-20 – 2017-01-21 (×2): 40 mg via INTRAVENOUS
  Filled 2017-01-20 (×2): qty 40

## 2017-01-20 MED ORDER — SODIUM CHLORIDE 0.9 % IV BOLUS (SEPSIS)
1000.0000 mL | Freq: Once | INTRAVENOUS | Status: AC
  Administered 2017-01-20: 1000 mL via INTRAVENOUS

## 2017-01-20 MED ORDER — ALBUTEROL SULFATE (2.5 MG/3ML) 0.083% IN NEBU: 3.0000 mL | INHALATION_SOLUTION | Freq: Four times a day (QID) | RESPIRATORY_TRACT | Status: DC | PRN

## 2017-01-20 MED ORDER — SODIUM CHLORIDE 0.9 % IV SOLN
INTRAVENOUS | Status: DC
  Administered 2017-01-20 – 2017-01-21 (×2): via INTRAVENOUS

## 2017-01-20 MED ORDER — ACETAMINOPHEN 500 MG PO TABS
1000.0000 mg | ORAL_TABLET | Freq: Once | ORAL | Status: AC
  Administered 2017-01-20: 1000 mg via ORAL
  Filled 2017-01-20: qty 2

## 2017-01-20 MED ORDER — TRAMADOL HCL 50 MG PO TABS
100.0000 mg | ORAL_TABLET | Freq: Two times a day (BID) | ORAL | Status: DC
  Administered 2017-01-20 – 2017-01-23 (×6): 100 mg via ORAL
  Filled 2017-01-20 (×6): qty 2

## 2017-01-20 MED ORDER — GABAPENTIN 300 MG PO CAPS
300.0000 mg | ORAL_CAPSULE | Freq: Every day | ORAL | Status: DC
  Administered 2017-01-20 – 2017-01-22 (×3): 300 mg via ORAL
  Filled 2017-01-20 (×3): qty 1

## 2017-01-20 MED ORDER — VITAMIN K1 10 MG/ML IJ SOLN
INTRAMUSCULAR | Status: AC
Start: 2017-01-20 — End: 2017-01-20
  Filled 2017-01-20: qty 1

## 2017-01-20 MED ORDER — CEFTRIAXONE SODIUM 1 G IJ SOLR
1.0000 g | Freq: Once | INTRAMUSCULAR | Status: AC
  Administered 2017-01-20: 1 g via INTRAVENOUS
  Filled 2017-01-20: qty 10

## 2017-01-20 MED ORDER — VITAMIN K1 10 MG/ML IJ SOLN
10.0000 mg | INTRAVENOUS | Status: AC
  Administered 2017-01-20: 10 mg via INTRAVENOUS
  Filled 2017-01-20: qty 1

## 2017-01-20 MED ORDER — CEFTRIAXONE SODIUM 1 G IJ SOLR
1.0000 g | INTRAMUSCULAR | Status: DC
  Administered 2017-01-21: 1 g via INTRAVENOUS
  Filled 2017-01-20 (×3): qty 10

## 2017-01-20 MED ORDER — DEXTROSE 5 % IV SOLN
500.0000 mg | INTRAVENOUS | Status: DC
  Filled 2017-01-20 (×2): qty 500

## 2017-01-20 MED ORDER — PROTHROMBIN COMPLEX CONC HUMAN 500 UNITS IV KIT
3000.0000 [IU] | PACK | Status: AC
  Administered 2017-01-20: 3000 [IU] via INTRAVENOUS
  Filled 2017-01-20: qty 120

## 2017-01-20 NOTE — Progress Notes (Addendum)
REVIEWED EMR. PT ADMITTED WITH GI BLEED ON COUMADIN. BP STABLE. AUG 28 Hb 8.0 AND TODAY Hb 6.1 INR 6.77. PMHx: THROMBOCYTOPENIA/EPISTAXIS 1 MO AGO/MELENA. SUPPORTIVE CARE. CLEAR LIQUID DIET. FULL CONSULT TO FOLLOW.

## 2017-01-20 NOTE — ED Notes (Signed)
Dr. Darrick Penna to [phone].

## 2017-01-20 NOTE — ED Notes (Addendum)
Date and time results received: 01/20/17 1935   Test: Hgb Critical Value: 6.1  Name of Provider Notified: Dr. Jodi Mourning  Orders Received? Or Actions Taken?: Notified MD

## 2017-01-20 NOTE — ED Provider Notes (Signed)
[facility]-EMER[facility]ENCY DEPT Provider Note   CSN: [id number] Arrival date & time: 01/20/17  1627     History   Chief Complaint Chief Complaint  Patient presents with  . Abnormal Lab    HPI [name] is a 66 y.o. male.  Patient was sent to the emergency department for abnormal lab. Patient was sent by primary care doctor in [place] for possible blood transfusion. Patient has had general weakness and fatigue for a few weeks. Patient did have a nosebleed a month ago. Patient is on blood thinners for peripheral vascular disease. Patient started Levaquin for pneumonia today. Patient's had mild cough and general weakness. Patient also admits to black stools recently. Patient denies history of GI bleed.      Past Medical History:  Diagnosis Date  . Blood clot in vein 2011   clot in right leg ,broke off into right lung  . Hypertension    pt. denies htn,was treated 10 yrs, ago,not on any meds now  . Pancreatitis, alcoholic     Patient Active Problem List   Diagnosis Date Noted  . Sepsis (HCC) 01/20/2017  . Pressure ulcer 05/14/2015  . 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 Colon polyp 04/30/2015  . PAD (peripheral artery disease) (HCC) 04/30/2015  . DVT of leg (deep venous thrombosis) (HCC) 02/18/2011    Past Surgical History:  Procedure Laterality Date  . AORTA - BILATERAL FEMORAL ARTERY BYPASS GRAFT Bilateral 05/13/2015   Procedure: AORTO BIFEMORAL BYPASS GRAFT USING 14 X8 MM X 40 CM HEMASHIELD GRAFT .BILATERAL FEMORAL ARTEERY ENDARTERECTOMY & BILATERAL PROFUNDAPLASTY;  Surgeon: Sherren Kerns, MD;  Location: [facility] OR;  Service: Vascular;  Laterality: Bilateral;  . HIP SURGERY     right       Home Medications    Prior to Admission medications   Medication Sig Start Date End Date Taking? Authorizing Provider  albuterol (PROVENTIL HFA) 108 (90 Base) MCG/ACT inhaler Inhale 2 puffs into the lungs every 6 (six) hours as needed for wheezing or shortness of  breath.   Yes [provider]  aspirin 81 MG chewable tablet Chew 81 mg by mouth daily.   Yes [provider]  gabapentin (NEURONTIN) 100 MG capsule Take 100 mg by mouth 3 (three) times daily as needed. For pain   Yes [provider]  gabapentin (NEURONTIN) 300 MG capsule Take 300 mg by mouth at bedtime. 12/30/16  Yes [provider]  levofloxacin (LEVAQUIN) 500 MG tablet Take 500 mg by mouth daily. 01/20/17  Yes [provider]  tamsulosin (FLOMAX) 0.4 MG CAPS capsule Take 0.4 mg by mouth daily.  07/21/15  Yes [provider]  traMADol (ULTRAM) 50 MG tablet Take 100 mg by mouth 2 (two) times daily. Pain   Yes [provider]  warfarin (COUMADIN) 10 MG tablet Take 1 tab as needed 01/09/17  Yes [provider]  warfarin (COUMADIN) 4 MG tablet Take 2 tablets (8 mg total) by mouth daily. Take 3 tablets (12 mg) today (05/19/15). Tomorrow 05/20/15, take your usual home dose of 8 mg (2 tablets). 05/19/15  Yes Maris Berger A, PA-C  rosuvastatin (CRESTOR) 10 MG tablet Take 1 tablet (10 mg total) by mouth daily. 05/19/15   Raymond Gurney, PA-C    Family History Family History  Problem Relation Age of Onset  . Heart disease Sister   . Heart disease Brother     Social History Social History  Substance Use Topics  . Smoking status: Former Smoker  Packs/day: 0.25    Years: 40.00    Types: Cigarettes    Start date: 09/26/1964    Quit date: 05/12/2015  . Smokeless tobacco: Never Used  . Alcohol use No     Allergies   Sulfa antibiotics   Review of Systems Review of Systems  Constitutional: Positive for fatigue. Negative for chills and fever.  HENT: Negative for congestion.   Eyes: Negative for visual disturbance.  Respiratory: Negative for shortness of breath.   Cardiovascular: Negative for chest pain.  Gastrointestinal: Positive for blood in stool, diarrhea and nausea. Negative for abdominal pain and vomiting.    Genitourinary: Negative for dysuria and flank pain.  Musculoskeletal: Negative for back pain, neck pain and neck stiffness.  Skin: Negative for rash.  Neurological: Positive for light-headedness. Negative for headaches.     Physical Exam Updated Vital Signs BP (!) 95/51   Pulse 78   Temp (!) 100.5 F (38.1 C) (Rectal)   Resp 20   Ht  (1.676 m)   Wt 64.9 kg (143 lb)   SpO2 99%   BMI 23.08 kg/m   Physical Exam  Constitutional: He is oriented to person, place, and time. He appears well-developed and well-nourished.  HENT:  Head: Normocephalic and atraumatic.  Eyes: Conjunctivae are normal. Right eye exhibits no discharge. Left eye exhibits no discharge.  Neck: Normal range of motion. Neck supple. No tracheal deviation present.  Cardiovascular: Normal rate and regular rhythm.   Pulmonary/Chest: Effort normal and breath sounds normal.  Abdominal: Soft. He exhibits no distension. There is no tenderness. There is no guarding.  Musculoskeletal: He exhibits no edema.  Neurological: He is alert and oriented to person, place, and time.  Skin: Skin is warm. No rash noted. There is pallor.  Psychiatric: He has a normal mood and affect.  Nursing note and vitals reviewed.    ED Treatments / Results  Labs (all labs ordered are listed, but only abnormal results are displayed) Labs Reviewed  PROTIME-INR - Abnormal; Notable for the following:       Result Value   Prothrombin Time 58.4 (*)    INR 6.77 (*)    All other components within normal limits  CBC WITH DIFFERENTIAL/PLATELET - Abnormal; Notable for the following:    RBC 2.22 (*)    Hemoglobin 6.1 (*)    HCT 20.1 (*)    RDW 22.8 (*)    All other components within normal limits  BASIC METABOLIC PANEL - Abnormal; Notable for the following:    Glucose, Bld 102 (*)    Calcium 8.5 (*)    All other components within normal limits  POC OCCULT BLOOD, ED - Abnormal; Notable for the following:    Fecal Occult Bld POSITIVE (*)     All other components within normal limits  CULTURE, BLOOD (ROUTINE X 2)  CULTURE, BLOOD (ROUTINE X 2)  OCCULT BLOOD X 1 CARD TO LAB, STOOL  URINALYSIS, ROUTINE W REFLEX MICROSCOPIC  I-STAT CG4 LACTIC ACID, ED  TYPE AND SCREEN  PREPARE RBC (CROSSMATCH)  ABO/RH    EKG  EKG Interpretation None       Radiology Dg Chest Portable 1 View  Result Date: 01/20/2017 CLINICAL DATA:  Generalized weakness.  Nose bleed. EXAM: PORTABLE CHEST 1 VIEW COMPARISON:  Chest radiograph May 14, 2015 FINDINGS: Cardiac silhouette is mildly enlarged and unchanged. Mediastinal silhouette is nonsuspicious. No pleural effusion or focal consolidation. No pneumothorax. Interval removal of life-support lines. Soft tissue planes and included osseous structures  are nonsuspicious. Old LEFT rib fractures. IMPRESSION: Mild cardiomegaly.  No acute pulmonary process. Electronically Signed   By: Awilda Metro M.D.   On: 01/20/2017 20:30    Procedures Procedures (including critical care time) CRITICAL CARE Performed by: Enid Skeens   Total critical care time: 75 minutes  Critical care time was exclusive of separately billable procedures and treating other patients.  Critical care was necessary to treat or prevent imminent or life-threatening deterioration.  Critical care was time spent personally by me on the following activities: development of treatment plan with patient and/or surrogate as well as nursing, discussions with consultants, evaluation of patient's response to treatment, examination of patient, obtaining history from patient or surrogate, ordering and performing treatments and interventions, ordering and review of laboratory studies, ordering and review of radiographic studies, pulse oximetry and re-evaluation of patient's condition.  Medications Ordered in ED Medications  0.9 %  sodium chloride infusion ( Intravenous New Bag/Given 01/20/17 1919)  sodium chloride 0.9 % bolus 1,000 mL (not  administered)  acetaminophen (TYLENOL) tablet 1,000 mg (not administered)  cefTRIAXone (ROCEPHIN) 1 g in dextrose 5 % 50 mL IVPB (not administered)  prothrombin complex conc human (KCENTRA) IVPB 3,000 Units (not administered)  phytonadione (VITAMIN K) 10 mg in dextrose 5 % 50 mL IVPB (not administered)  azithromycin (ZITHROMAX) 500 mg in dextrose 5 % 250 mL IVPB (not administered)     Initial Impression / Assessment and Plan / ED Course  I have reviewed the triage vital signs and the nursing notes.  Pertinent labs & imaging results that were available during my care of the patient were reviewed by me and considered in my medical decision making (see chart for details).    Patient presents for abnormal lab results. Clinical concern for symptomatic anemia. Plan to repeat blood work and INR. Patient not actively bleeding in the ER however with melanotic stools recently concern for source in addition to recent antibiotics that may have altered his INR.  Emergent blood ordered, Hb 6.1, inr pending.  iNR returned greater than 6. Patient is actively bleeding with hypotension. K Centrum vitamin K ordered. Reversal protocol ordered. Discussed with hospice for ICU admission. Updated family on severity of illness. IV antibiotics ordered.  Discussed with nursing staff or 30 mL/kg of IV fluids.  The patients results and plan were reviewed and discussed.   Any x-rays performed were independently reviewed by myself.   Differential diagnosis were considered with the presenting HPI.  Medications  0.9 %  sodium chloride infusion ( Intravenous New Bag/Given 01/20/17 1919)  sodium chloride 0.9 % bolus 1,000 mL (not administered)  acetaminophen (TYLENOL) tablet 1,000 mg (not administered)  cefTRIAXone (ROCEPHIN) 1 g in dextrose 5 % 50 mL IVPB (not administered)  prothrombin complex conc human (KCENTRA) IVPB 3,000 Units (not administered)  phytonadione (VITAMIN K) 10 mg in dextrose 5 % 50 mL IVPB (not  administered)  azithromycin (ZITHROMAX) 500 mg in dextrose 5 % 250 mL IVPB (not administered)    Vitals:   01/20/17 1641 01/20/17 1925 01/20/17 1953 01/20/17 2000  BP: (!) 102/57 (!) 95/52  (!) 95/51  Pulse: 80 74  78  Resp: Temp: 98.5 F (36.9 C)  (!) 100.5 F (38.1 C)   TempSrc: Oral  Rectal   SpO2: 100% 100%  99%  Weight: 64.9 kg (143 lb)     Height:  (1.676 m)       Final diagnoses:  Symptomatic anemia  Melena  Community acquired pneumonia, unspecified laterality  Sepsis, due to unspecified organism (HCC)  Transient hypotension  Elevated INR    Admission/ observation were discussed with the admitting physician, patient and/or family and they are comfortable with the plan.    Final Clinical Impressions(s) / ED Diagnoses   Final diagnoses:  Symptomatic anemia  Melena  Community acquired pneumonia, unspecified laterality  Sepsis, due to unspecified organism (HCC)  Transient hypotension  Elevated INR    New Prescriptions New Prescriptions   No medications on file     Blane Ohara, MD 01/20/17 2050

## 2017-01-20 NOTE — ED Notes (Signed)
Pt verbalized that wife Steward Drone) can be called for any changes or questions. [phone].

## 2017-01-20 NOTE — H&P (Addendum)
History and Physical  [name] ZOX:[id number] DOB: July 03, 1950 DOA: 01/20/2017  Referring physician: Dr Jodi Mourning, ED physician PCP: [place], Julie M, NP  Outpatient Specialists:  Dr. Darrick Penna, vascular surgery  Patient Coming From: home  Chief Complaint: Abnormal lab  HPI: [name] is a 66 y.o. male with a history of DVT with PE, coronary artery disease, hypertension, status post aortobifemoral bypass grafts. Patient seen for elevated INR. Patient has been having a cough for the past couple of weeks, which has been worsening with. Sputum production. Patient saw primary care physician yesterday and was given a prescription for Levaquin. His INR was also checked. This evening, the patient got a call from his primary care physician who instructed him to go to the hospital due to supratherapeutic INR. Patient has been having black stools over the past several days. No palliating or provoking factors. No sick contacts. Patient is a smoker.  Emergency Department Course: 2 units packed red blood cells ordered. INR 6.7. Patient's blood pressure was in the 90s systolically. IV fluids started rectal temp elevated. Started on Rocephin. Fluid resuscitation given in ED.   Review of Systems:   Pt denies any fevers, chills, nausea, vomiting, diarrhea, constipation, abdominal pain, shortness of breath, dyspnea on exertion, orthopnea, wheezing, palpitations, headache, vision changes, lightheadedness, dizziness, rectal bleeding.  Review of systems are otherwise negative  Past Medical History:  Diagnosis Date  . Blood clot in vein 2011   clot in right leg ,broke off into right lung  . Hypertension    pt. denies htn,was treated 10 yrs, ago,not on any meds now  . Pancreatitis, alcoholic    Past Surgical History:  Procedure Laterality Date  . AORTA - BILATERAL FEMORAL ARTERY BYPASS GRAFT Bilateral 05/13/2015   Procedure: AORTO BIFEMORAL BYPASS GRAFT USING 14 X8 MM X 40 CM HEMASHIELD GRAFT .BILATERAL  FEMORAL ARTEERY ENDARTERECTOMY & BILATERAL PROFUNDAPLASTY;  Surgeon: Sherren Kerns, MD;  Location: [facility] OR;  Service: Vascular;  Laterality: Bilateral;  . HIP SURGERY     right   Social History:  reports that he quit smoking about 20 months ago. His smoking use included Cigarettes. He started smoking about 52 years ago. He has a 10.00 pack-year smoking history. He has never used smokeless tobacco. He reports that he does not drink alcohol or use drugs. Patient lives at Home  Allergies  Allergen Reactions  . Sulfa Antibiotics Itching and Rash    Family History  Problem Relation Age of Onset  . Heart disease Sister   . Heart disease Brother       Prior to Admission medications   Medication Sig Start Date End Date Taking? Authorizing Provider  albuterol (PROVENTIL HFA) 108 (90 Base) MCG/ACT inhaler Inhale 2 puffs into the lungs every 6 (six) hours as needed for wheezing or shortness of breath.   Yes [provider]  aspirin 81 MG chewable tablet Chew 81 mg by mouth daily.   Yes [provider]  gabapentin (NEURONTIN) 100 MG capsule Take 100 mg by mouth 3 (three) times daily as needed. For pain   Yes [provider]  gabapentin (NEURONTIN) 300 MG capsule Take 300 mg by mouth at bedtime. 12/30/16  Yes [provider]  levofloxacin (LEVAQUIN) 500 MG tablet Take 500 mg by mouth daily. 01/20/17  Yes [provider]  tamsulosin (FLOMAX) 0.4 MG CAPS capsule Take 0.4 mg by mouth daily.  07/21/15  Yes [provider]  traMADol (ULTRAM) 50 MG tablet Take 100  mg by mouth 2 (two) times daily. Pain   Yes [provider]  warfarin (COUMADIN) 10 MG tablet Take 1 tab as needed 01/09/17  Yes [provider]  warfarin (COUMADIN) 4 MG tablet Take 2 tablets (8 mg total) by mouth daily. Take 3 tablets (12 mg) today (05/19/15). Tomorrow 05/20/15, take your usual home dose of 8 mg (2 tablets). 05/19/15  Yes Maris Berger A, PA-C  rosuvastatin  (CRESTOR) 10 MG tablet Take 1 tablet (10 mg total) by mouth daily. 05/19/15   Raymond Gurney, PA-C    Physical Exam: BP 104/61   Pulse 84   Temp (!) 100.5 F (38.1 C) (Rectal)   Resp 16   Ht  (1.676 m)   Wt 64.9 kg (143 lb)   SpO2 100%   BMI 23.08 kg/m   General: Elderly black male. Awake and alert and oriented x3. No acute cardiopulmonary distress.  HEENT: Normocephalic atraumatic.  Right and left ears normal in appearance.  Pupils equal, round, reactive to light. Extraocular muscles are intact. Sclerae anicteric and noninjected.  Moist mucosal membranes. No mucosal lesions.  Neck: Neck supple without lymphadenopathy. No carotid bruits. No masses palpated.  Cardiovascular: Regular rate with normal S1-S2 sounds. No murmurs, rubs, gallops auscultated. No JVD.  Respiratory: Good respiratory effort with no wheezes, rhonchi. Rales in right midlung field. No accessory muscle use. Abdomen: Soft, nontender, nondistended. Active bowel sounds. No masses or hepatosplenomegaly  Skin: No rashes, lesions, or ulcerations.  Dry, warm to touch. 2+ dorsalis pedis and radial pulses. Musculoskeletal: No calf or leg pain. All major joints not erythematous nontender.  No upper or lower joint deformation.  Good ROM.  No contractures  Psychiatric: Intact judgment and insight. Pleasant and cooperative. Neurologic: No focal neurological deficits. Strength is 5/5 and symmetric in upper and lower extremities.  Cranial nerves II through XII are grossly intact.           Labs on Admission: I have personally reviewed following labs and imaging studies  CBC:  Recent Labs Lab 01/20/17 1828  WBC 4.1  NEUTROABS 2.2  HGB 6.1*  HCT 20.1*  MCV 90.5  PLT PLATELET CLUMPS NOTED ON SMEAR, COUNT APPEARS ADEQUATE   Basic Metabolic Panel:  Recent Labs Lab 01/20/17 1828  NA 135  K 3.9  CL 104  CO2 24  GLUCOSE 102*  BUN 20  CREATININE 0.75  CALCIUM 8.5*   GFR: Estimated Creatinine Clearance: 83.1  mL/min (by C-G formula based on SCr of 0.75 mg/dL). Liver Function Tests: No results for input(s): AST, ALT, ALKPHOS, BILITOT, PROT, ALBUMIN in the last 168 hours. No results for input(s): LIPASE, AMYLASE in the last 168 hours. No results for input(s): AMMONIA in the last 168 hours. Coagulation Profile:  Recent Labs Lab 01/20/17 1828  INR 6.77*   Cardiac Enzymes: No results for input(s): CKTOTAL, CKMB, CKMBINDEX, TROPONINI in the last 168 hours. BNP (last 3 results) No results for input(s): PROBNP in the last 8760 hours. HbA1C: No results for input(s): HGBA1C in the last 72 hours. CBG: No results for input(s): GLUCAP in the last 168 hours. Lipid Profile: No results for input(s): CHOL, HDL, LDLCALC, TRIG, CHOLHDL, LDLDIRECT in the last 72 hours. Thyroid Function Tests: No results for input(s): TSH, T4TOTAL, FREET4, T3FREE, THYROIDAB in the last 72 hours. Anemia Panel: No results for input(s): VITAMINB12, FOLATE, FERRITIN, TIBC, IRON, RETICCTPCT in the last 72 hours. Urine analysis:    Component Value Date/Time   COLORURINE YELLOW 06/22/2015 1055  APPEARANCEUR CLEAR 06/22/2015 1055   LABSPEC 1.008 06/22/2015 1055   PHURINE 6.0 06/22/2015 1055   GLUCOSEU NEGATIVE 06/22/2015 1055   HGBUR TRACE (A) 06/22/2015 1055   BILIRUBINUR NEGATIVE 06/22/2015 1055   KETONESUR NEGATIVE 06/22/2015 1055   PROTEINUR NEGATIVE 06/22/2015 1055   NITRITE NEGATIVE 06/22/2015 1055   LEUKOCYTESUR NEGATIVE 06/22/2015 1055   Sepsis Labs: (procalcitonin:4,lacticidven:4) )No results found for this or any previous visit (from the past 240 hour(s)).   Radiological Exams on Admission: Dg Chest Portable 1 View  Result Date: 01/20/2017 CLINICAL DATA:  Generalized weakness.  Nose bleed. EXAM: PORTABLE CHEST 1 VIEW COMPARISON:  Chest radiograph May 14, 2015 FINDINGS: Cardiac silhouette is mildly enlarged and unchanged. Mediastinal silhouette is nonsuspicious. No pleural effusion or focal  consolidation. No pneumothorax. Interval removal of life-support lines. Soft tissue planes and included osseous structures are nonsuspicious. Old LEFT rib fractures. IMPRESSION: Mild cardiomegaly.  No acute pulmonary process. Electronically Signed   By: Awilda Metro M.D.   On: 01/20/2017 20:30    Assessment/Plan: Principal Problem:   Sepsis (HCC) Active Problems:   DVT of leg (deep venous thrombosis) (HCC)   PAD (peripheral artery disease) (HCC)   CAP (community acquired pneumonia)   Supratherapeutic INR   Upper GI bleeding   Acute blood loss anemia    This patient was discussed with the ED physician, including pertinent vitals, physical exam findings, labs, and imaging.  We also discussed care given by the ED provider.  #1 sepsis  Admit to stepdown  Fluid resuscitation done  No tachycardia. Good peripheral pulses.  Pro calcitonin #2 acute blood loss anemia  Transfuse 2 units.  Check CBC tomorrow morning #3 supratherapeutic INR  Reverse with K Centra  INR tomorrow morning  Hold Coumadin #4 GI bleed  IV Protonix  Clear liquid diet  GI to consult  Hold Coumadin and aspirin #5. Community Acquired pneumonia  No radiologic evidence of pneumonia  Possibly viral  Antibiotics: Rocephin and Zithromax  Robitussin  Blood cultures drawn in the emergency department  Sputum cultures  CBC tomorrow  Strep antigen by urine  Influenza screen #6 peripheral artery disease  #7 history of DVT  DVT prophylaxis:  SCDs Consultants:  GI Code Status:  Full code Family Communication:  Wife in the room  Disposition Plan:  Patient to return home following admission   Noralee Chars [facility] Pager [phone]  If 7PM-7AM, please contact night-coverage [email or web address] Password [email or web address]

## 2017-01-20 NOTE — ED Triage Notes (Signed)
Pt sent by PCP in [place] for need for blood transfusion.  Pt c/o generalized weakness.  Pt had a nose bleed a month ago and is on a blood thinner- Coumadin. Dx PNA yesterday.

## 2017-01-20 NOTE — ED Notes (Signed)
Per Dr. Jodi Mourning, give [name] and vitamin k first. After lab draws the stat INR then start blood products.

## 2017-01-20 NOTE — ED Notes (Signed)
Date and time results received: 01/20/17 2019   Test: INR Critical Value: 6.77  Name of Provider Notified: Dr.Zavitz  Orders Received? Or Actions Taken?: MD notified

## 2017-01-21 ENCOUNTER — Encounter (HOSPITAL_COMMUNITY): Payer: Self-pay | Admitting: *Deleted

## 2017-01-21 ENCOUNTER — Encounter (HOSPITAL_COMMUNITY)
Admission: EM | Disposition: A | Discharge status: Home Health | Payer: Self-pay | Source: Home / Self Care | Attending: Family Medicine

## 2017-01-21 DIAGNOSIS — I824Y9 Acute embolism and thrombosis of unspecified deep veins of unspecified proximal lower extremity: Secondary | ICD-10-CM

## 2017-01-21 DIAGNOSIS — D62 Acute posthemorrhagic anemia: Secondary | ICD-10-CM

## 2017-01-21 DIAGNOSIS — K2970 Gastritis, unspecified, without bleeding: Secondary | ICD-10-CM

## 2017-01-21 DIAGNOSIS — J181 Lobar pneumonia, unspecified organism: Secondary | ICD-10-CM

## 2017-01-21 DIAGNOSIS — K5710 Diverticulosis of small intestine without perforation or abscess without bleeding: Secondary | ICD-10-CM

## 2017-01-21 HISTORY — PX: ESOPHAGOGASTRODUODENOSCOPY: SHX5428

## 2017-01-21 HISTORY — PX: BIOPSY: SHX5522

## 2017-01-21 LAB — PROTIME-INR
INR: 1.23
INR: 1.18
INR: 1.17
INR: 1.22
PROTHROMBIN TIME: 14.9 s (ref 11.4–15.2)
PROTHROMBIN TIME: 15.3 s — AB (ref 11.4–15.2)
Prothrombin Time: 15.4 seconds — ABNORMAL HIGH (ref 11.4–15.2)
Prothrombin Time: 14.8 seconds (ref 11.4–15.2)

## 2017-01-21 LAB — INFLUENZA PANEL BY PCR (TYPE A & B)
INFLBPCR: NEGATIVE
Influenza A By PCR: NEGATIVE

## 2017-01-21 LAB — COMPREHENSIVE METABOLIC PANEL
ALT: 10 U/L — AB (ref 17–63)
AST: 12 U/L — AB (ref 15–41)
Albumin: 2.7 g/dL — ABNORMAL LOW (ref 3.5–5.0)
Alkaline Phosphatase: 53 U/L (ref 38–126)
Anion gap: 10 (ref 5–15)
BUN: 14 mg/dL (ref 6–20)
CHLORIDE: 102 mmol/L (ref 101–111)
CO2: 20 mmol/L — AB (ref 22–32)
CREATININE: 0.73 mg/dL (ref 0.61–1.24)
Calcium: 8.2 mg/dL — ABNORMAL LOW (ref 8.9–10.3)
GFR calc non Af Amer: >60 mL/min (ref >60)
Glucose, Bld: 99 mg/dL (ref 65–99)
Potassium: 3.9 mmol/L (ref 3.5–5.1)
SODIUM: 132 mmol/L — AB (ref 135–145)
Total Bilirubin: 2 mg/dL — ABNORMAL HIGH (ref 0.3–1.2)
Total Protein: 6 g/dL — ABNORMAL LOW (ref 6.5–8.1)

## 2017-01-21 LAB — CBC
HEMATOCRIT: 23.6 % — AB (ref 39.0–52.0)
HEMOGLOBIN: 7.7 g/dL — AB (ref 13.0–17.0)
MCH: 29.5 pg (ref 26.0–34.0)
MCHC: 32.6 g/dL (ref 30.0–36.0)
MCV: 90.4 fL (ref 78.0–100.0)
Platelets: 166 10*3/uL (ref 150–400)
RBC: 2.61 MIL/uL — AB (ref 4.22–5.81)
RDW: 19.6 % — ABNORMAL HIGH (ref 11.5–15.5)
WBC: 3.9 10*3/uL — AB (ref 4.0–10.5)

## 2017-01-21 LAB — URINALYSIS, ROUTINE W REFLEX MICROSCOPIC
BILIRUBIN URINE: NEGATIVE
Glucose, UA: NEGATIVE mg/dL
Hgb urine dipstick: NEGATIVE
KETONES UR: NEGATIVE mg/dL
LEUKOCYTES UA: NEGATIVE
NITRITE: NEGATIVE
PROTEIN: NEGATIVE mg/dL
Specific Gravity, Urine: 1.02 (ref 1.005–1.030)
pH: 5 (ref 5.0–8.0)

## 2017-01-21 LAB — PROCALCITONIN: PROCALCITONIN: 6.77 ng/mL

## 2017-01-21 LAB — STREP PNEUMONIAE URINARY ANTIGEN: Strep Pneumo Urinary Antigen: NEGATIVE

## 2017-01-21 LAB — MRSA PCR SCREENING: MRSA by PCR: NEGATIVE

## 2017-01-21 SURGERY — EGD (ESOPHAGOGASTRODUODENOSCOPY)
Anesthesia: Moderate Sedation

## 2017-01-21 MED ORDER — MEPERIDINE HCL 100 MG/ML IJ SOLN
INTRAMUSCULAR | Status: AC
  Filled 2017-01-21: qty 2

## 2017-01-21 MED ORDER — MIDAZOLAM HCL 5 MG/5ML IJ SOLN
INTRAMUSCULAR | Status: DC | PRN
  Administered 2017-01-21: 2 mg via INTRAVENOUS
  Administered 2017-01-21: 1 mg via INTRAVENOUS

## 2017-01-21 MED ORDER — AZITHROMYCIN 250 MG PO TABS: 250.0000 mg | ORAL_TABLET | Freq: Every day | ORAL | Status: DC

## 2017-01-21 MED ORDER — STERILE WATER FOR IRRIGATION IR SOLN
Status: DC | PRN
  Administered 2017-01-21: 2.5 mL

## 2017-01-21 MED ORDER — LIDOCAINE VISCOUS 2 % MT SOLN
OROMUCOSAL | Status: AC
  Filled 2017-01-21: qty 15

## 2017-01-21 MED ORDER — ENSURE ENLIVE PO LIQD
237.0000 mL | Freq: Two times a day (BID) | ORAL | Status: DC
  Administered 2017-01-22 (×2): 237 mL via ORAL

## 2017-01-21 MED ORDER — PROMETHAZINE HCL 25 MG/ML IJ SOLN
12.5000 mg | Freq: Once | INTRAMUSCULAR | Status: AC
  Administered 2017-01-21: 12.5 mg via INTRAVENOUS
  Filled 2017-01-21: qty 1

## 2017-01-21 MED ORDER — METOCLOPRAMIDE HCL 5 MG/ML IJ SOLN
10.0000 mg | Freq: Once | INTRAMUSCULAR | Status: AC
  Administered 2017-01-21: 10 mg via INTRAVENOUS
  Filled 2017-01-21: qty 2

## 2017-01-21 MED ORDER — BOOST / RESOURCE BREEZE PO LIQD
1.0000 | Freq: Two times a day (BID) | ORAL | Status: DC
  Administered 2017-01-22 (×2): 1 via ORAL

## 2017-01-21 MED ORDER — PANTOPRAZOLE SODIUM 40 MG PO TBEC
40.0000 mg | DELAYED_RELEASE_TABLET | Freq: Two times a day (BID) | ORAL | Status: DC
  Administered 2017-01-22 – 2017-01-23 (×3): 40 mg via ORAL
  Filled 2017-01-21 (×3): qty 1

## 2017-01-21 MED ORDER — MEPERIDINE HCL 100 MG/ML IJ SOLN
INTRAMUSCULAR | Status: DC | PRN
  Administered 2017-01-21: 25 mg via INTRAVENOUS

## 2017-01-21 MED ORDER — MIDAZOLAM HCL 5 MG/5ML IJ SOLN
INTRAMUSCULAR | Status: AC
  Filled 2017-01-21: qty 10

## 2017-01-21 NOTE — Progress Notes (Signed)
PROGRESS NOTE    [name]  WUJ:[id number]  DOB: 1950-07-13  DOA: 01/20/2017 PCP: [place], Julie M, NP   Brief Admission Hx: [name] is a 66 y.o. male with a history of DVT with PE, coronary artery disease, hypertension, status post aortobifemoral bypass grafts. Patient admitted for elevated INR, GI bleeding, Anemia and possible pneumonia.   MDM/Assessment & Plan:   1. GI Hemorrhage - GI has been consulted and will see patient today.  He is hemodynamically stabilized and followed closely in SDU. Continue clear liquids and IV protonix. 2. Acute blood loss anemia - s/p 2 units PRBCs.  3. Supratherapeutic INR - Treated with Vit K and now normalized.   4. Community Acquired Pneumonia - empirically started on rocephin and zithromax. Follow clinically.  5. History of DVT - Holding anticoagulation at this time.   DVT prophylaxis: SCDs Code Status: FULL  Family Communication: bedside Disposition Plan: TBD   Consultants:  GI  Subjective: Pt awake, alert, no complaints, says he feels much better after blood transfusion.   Objective: Vitals:   01/21/17 0545 01/21/17 0600 01/21/17 0615 01/21/17 0620  BP: 99/60 107/70 106/61 106/61  Pulse: 70 69 69 70  Resp: Temp:    99.2 F (37.3 C)  TempSrc:    Oral  SpO2: 94% 95% 94% 94%  Weight:      Height:        Intake/Output Summary (Last 24 hours) at 01/21/17 0734 Last data filed at 01/21/17 9562  Gross per 24 hour  Intake            884.5 ml  Output                0 ml  Net            884.5 ml   Filed Weights   01/20/17 1641 01/20/17 2229 01/21/17 0406  Weight: 64.9 kg (143 lb) 64.8 kg (142 lb 13.7 oz) 66.2 kg (145 lb 15.1 oz)     REVIEW OF SYSTEMS  As per history otherwise all reviewed and reported negative  Exam:  General exam: awake, alert, NAD.  Respiratory system: Clear. No increased work of breathing. Cardiovascular system: S1 & S2 heard, RRR. Gastrointestinal system: Abdomen is  nondistended, soft and nontender. Normal bowel sounds heard. Central nervous system: Alert and oriented. No focal neurological deficits. Extremities: no CCE.  Data Reviewed: Basic Metabolic Panel:  Recent Labs Lab 01/20/17 1828  NA 135  K 3.9  CL 104  CO2 24  GLUCOSE 102*  BUN 20  CREATININE 0.75  CALCIUM 8.5*   Liver Function Tests: No results for input(s): AST, ALT, ALKPHOS, BILITOT, PROT, ALBUMIN in the last 168 hours. No results for input(s): LIPASE, AMYLASE in the last 168 hours. No results for input(s): AMMONIA in the last 168 hours. CBC:  Recent Labs Lab 01/20/17 1828  WBC 4.1  NEUTROABS 2.2  HGB 6.1*  HCT 20.1*  MCV 90.5  PLT PLATELET CLUMPS NOTED ON SMEAR, COUNT APPEARS ADEQUATE   Cardiac Enzymes: No results for input(s): CKTOTAL, CKMB, CKMBINDEX, TROPONINI in the last 168 hours. CBG (last 3)  No results for input(s): GLUCAP in the last 72 hours. Recent Results (from the past 240 hour(s))  Blood culture (routine x 2)     Status: None (Preliminary result)   Collection Time: 01/20/17  8:30 PM  Result Value Ref Range Status   Specimen Description BLOOD RIGHT ARM  Final   Special  Requests   Final    Blood Culture results may not be optimal due to an inadequate volume of blood received in culture bottles   Culture NO GROWTH < 12 HOURS  Final   Report Status PENDING  Incomplete  MRSA PCR Screening     Status: None   Collection Time: 01/20/17 10:25 PM  Result Value Ref Range Status   MRSA by PCR NEGATIVE NEGATIVE Final    Comment:        The GeneXpert MRSA Assay (FDA approved for NASAL specimens only), is one component of a comprehensive MRSA colonization surveillance program. It is not intended to diagnose MRSA infection nor to guide or monitor treatment for MRSA infections.   Blood culture (routine x 2)     Status: None (Preliminary result)   Collection Time: 01/20/17 11:24 PM  Result Value Ref Range Status   Specimen Description BLOOD RIGHT HAND   Final   Special Requests   Final    BOTTLES DRAWN AEROBIC AND ANAEROBIC Blood Culture adequate volume   Culture PENDING  Incomplete   Report Status PENDING  Incomplete     Studies: Dg Chest Portable 1 View  Result Date: 01/20/2017 CLINICAL DATA:  Generalized weakness.  Nose bleed. EXAM: PORTABLE CHEST 1 VIEW COMPARISON:  Chest radiograph May 14, 2015 FINDINGS: Cardiac silhouette is mildly enlarged and unchanged. Mediastinal silhouette is nonsuspicious. No pleural effusion or focal consolidation. No pneumothorax. Interval removal of life-support lines. Soft tissue planes and included osseous structures are nonsuspicious. Old LEFT rib fractures. IMPRESSION: Mild cardiomegaly.  No acute pulmonary process. Electronically Signed   By: Awilda Metro M.D.   On: 01/20/2017 20:30    Scheduled Meds: . gabapentin  300 mg Oral QHS  . pantoprazole (PROTONIX) IV  40 mg Intravenous Q12H  . tamsulosin  0.4 mg Oral Daily  . traMADol  100 mg Oral BID   Continuous Infusions: . sodium chloride Stopped (01/20/17 2111)  . azithromycin    . cefTRIAXone (ROCEPHIN)  IV     Principal Problem:   Sepsis (HCC) Active Problems:   DVT of leg (deep venous thrombosis) (HCC)   PAD (peripheral artery disease) (HCC)   CAP (community acquired pneumonia)   Supratherapeutic INR   Upper GI bleeding   Acute blood loss anemia  Critical Care Time spent: 34 mins  Standley Dakins, MD, FAAFP [facility] Pager [phone]  If 7PM-7AM, please contact night-coverage [email or web address] Password [email or web address] 01/21/2017, 7:34 AM    LOS: 1 day

## 2017-01-21 NOTE — H&P (View-Only) (Signed)
Referring Provider: No ref. provider found Primary Care Physician:  Jordan, Julie M, NP Primary Gastroenterologist:  Sandi Fields  Reason for Consultation:  GI BLEED   Impression: ADMITTED WITH MELENA AND ANEMIA. LAST BLACK STOOL YESTERDAY. TAKING ASA AND COUMADIN WITHOUT PPI AND A KNOWN HISTORY OF THROMBOCYTOPENIA. DIFFERENTIAL DIAGNOSIS INCLUDES: PUD, AVMs, DIEULAFOY'S LESION, LESS LIKELY GE JUNCTION TUMOR, OR GASTRIC CA.  Plan: 1. NPO EXCEPT SIPS WITH MEDS 2. PROTONIX 40 MV IV BID 3. EGD TODAY. DISCUSSED PROCEDURE, BENEFITS, & RISKS: < 1% chance of medication reaction, bleeding, perforation, or rupture of spleen/liver. NEEDS PHENERGAN/REGLAN 12.5 MG IV PRIOR TO EGD. 4. HOLD ASA. WILL NEED PPI DAILY INDEFINITELY WHEN RESTARTING ASA.      HPI:  PT IN HIS USUAL STATE OF HEALTH. REQUIRES ASA/COUMADIN DUE TO ASCVD. WAS ON ASA/COUMADIN AND NO PPI. HAD A HEAVY NOSEBLEED ONE MO AGO AND Hb NOTED TO BE 8.0. PRESENTED TO ED YESTERDAY WITH SUDDEN ONSET OF BLACK TARRY STOOLS YESTERDAY. IN ED BP STABLE AND Hb NOTED TO BE 6.1 AND INR 6.77.   PT DENIES FEVER, CHILLS, HEMATOCHEZIA, nausea, vomiting, diarrhea, CHEST PAIN, SHORTNESS OF BREATH,  CHANGE IN BOWEL IN HABITS, constipation, abdominal pain, problems swallowing, problems with sedation, OR heartburn or indigestion.  Past Medical History:  Diagnosis Date  . Blood clot in vein 2011   clot in right leg ,broke off into right lung  . Hypertension    pt. denies htn,was treated 10 yrs, ago,not on any meds now  . Pancreatitis, alcoholic     Past Surgical History:  Procedure Laterality Date  . 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 HIP SURGERY     right    Prior to Admission medications   Medication Sig Start Date End Date Taking? Authorizing Provider  albuterol (PROVENTIL HFA) 108 (90 Base) MCG/ACT inhaler Inhale 2 puffs into the lungs every 6 (six) hours as needed for wheezing or shortness of breath.    Yes [provider]  aspirin 81 MG chewable tablet Chew 81 mg by mouth daily.   Yes [provider]  gabapentin (NEURONTIN) 100 MG capsule Take 100 mg by mouth 3 (three) times daily as needed. For pain   Yes [provider]  gabapentin (NEURONTIN) 300 MG capsule Take 300 mg by mouth at bedtime. 12/30/16  Yes [provider]  levofloxacin (LEVAQUIN) 500 MG tablet Take 500 mg by mouth daily. 01/20/17  Yes [provider]  tamsulosin (FLOMAX) 0.4 MG CAPS capsule Take 0.4 mg by mouth daily.  07/21/15  Yes [provider]  traMADol (ULTRAM) 50 MG tablet Take 100 mg by mouth 2 (two) times daily. Pain   Yes [provider]  warfarin (COUMADIN) 10 MG tablet Take 1 tab as needed 01/09/17  Yes [provider]  warfarin (COUMADIN) 4 MG tablet Take 2 tablets (8 mg total) by mouth daily. Take 3 tablets (12 mg) today (05/19/15). Tomorrow 05/20/15, take your usual home dose of 8 mg (2 tablets). 05/19/15  Yes Trinh, Kimberly A, PA-C  rosuvastatin (CRESTOR) 10 MG tablet Take 1 tablet (10 mg total) by mouth daily. 05/19/15   Trinh, Kimberly A, PA-C    Current Facility-Administered Medications  Medication Dose Route Frequency Provider Last Rate Last Dose  . 0.9 %  sodium chloride infusion   Intravenous Continuous Johnson, Clanford L, MD 60 mL/hr at 01/21/17 0752    . albuterol (PROVENTIL) (2.5 MG/3ML) 0.083% nebulizer solution 3 mL  3   mL Inhalation Q6H PRN Stinson, Jacob J, DO      . [START ON 01/22/2017] azithromycin (ZITHROMAX) tablet 250 mg  250 mg Oral Daily Johnson, Clanford L, MD      . cefTRIAXone (ROCEPHIN) 1 g in dextrose 5 % 50 mL IVPB  1 g Intravenous Q24H Stinson, Jacob J, DO      . gabapentin (NEURONTIN) capsule 100 mg  100 mg Oral TID PRN Stinson, Jacob J, DO      . gabapentin (NEURONTIN) capsule 300 mg  300 mg Oral QHS Stinson, Jacob J, DO   300 mg at 01/20/17 2359  . pantoprazole (PROTONIX) injection 40 mg  40 mg Intravenous Q12H Stinson,  Jacob J, DO   40 mg at 01/20/17 2320  . tamsulosin (FLOMAX) capsule 0.4 mg  0.4 mg Oral Daily Stinson, Jacob J, DO      . traMADol (ULTRAM) tablet 100 mg  100 mg Oral BID Stinson, Jacob J, DO   100 mg at 01/20/17 2359    Allergies as of 01/20/2017 - Review Complete 01/20/2017  Allergen Reaction Noted  . Sulfa antibiotics Itching and Rash 02/18/2011    Family History  Problem Relation Age of Onset  . Heart disease Sister   . Heart disease Brother      Social History   Social History  . Marital status: Married    Spouse name: N/A  . Number of children: N/A  . Years of education: N/A   Occupational History  . Not on file.   Social History Main Topics  . Smoking status: Former Smoker    Packs/day: 0.25    Years: 40.00    Types: Cigarettes    Start date: 09/26/1964    Quit date: 05/12/2015  . Smokeless tobacco: Never Used  . Alcohol use No  . Drug use: No  . Sexual activity: Not on file   Other Topics Concern  . Not on file   Social History Narrative  . No narrative on file    Review of Systems: PER HPI OTHERWISE ALL SYSTEMS ARE NEGATIVE.   Vitals: Blood pressure 106/61, pulse 70, temperature 98.7 F (37.1 C), temperature source Oral, resp. rate 16, height 5' 6" (1.676 m), weight 145 lb 15.1 oz (66.2 kg), SpO2 94 %.  Physical Exam: General:   Alert,  Well-developed, well-nourished, pleasant and cooperative in NAD Head:  Normocephalic and atraumatic. Eyes:  Sclera clear, no icterus.   Conjunctiva pink. Mouth:  No lesions, dentition ABnormal. Neck:  Supple; no masses. Lungs:  Clear throughout to auscultation.   No wheezes. No acute distress. Heart:  Regular rate and rhythm; no murmurs, clicks, rubs,  or gallops. Abdomen:  Soft, nontender and nondistended. No masses noted. Normal bowel sounds, without guarding, and without rebound.   Msk:  Symmetrical without gross deformities. Normal posture. Extremities:  Without edema. Neurologic:  Alert and  oriented x4;   grossly normal neurologically. Cervical Nodes:  No significant cervical adenopathy. Psych:  Alert and cooperative. Normal mood and affect.  Lab Results:  Recent Labs  01/20/17 1828  WBC 4.1  HGB 6.1*  HCT 20.1*  PLT PLATELET CLUMPS NOTED ON SMEAR, COUNT APPEARS ADEQUATE   BMET  Recent Labs  01/20/17 1828  NA 135  K 3.9  CL 104  CO2 24  GLUCOSE 102*  BUN 20  CREATININE 0.75  CALCIUM 8.5*   LFT No results for input(s): PROT, ALBUMIN, AST, ALT, ALKPHOS, BILITOT, BILIDIR, IBILI in the last 72 hours.   Studies/Results:   JAN 2017:  CT ANGIO-1. Probable 7 mm enhancing polyp in the proximal transverse colon just beyond the hepatic flexure. Consider referral to gastroenterology for colonoscopy if 1 has not been recently performed. 2. Colonic diverticular disease without CT evidence of active inflammation.    LOS: 1 day   Sandi Fields  01/21/2017, 8:09 AM    

## 2017-01-21 NOTE — Op Note (Signed)
[facility] Patient Name: [name] Procedure Date: 01/21/2017 5:05 PM MRN: [id number] Date of Birth: [birth year] Attending MD: Jonette Eva MD, MD CSN: [id number] Age: 66 Admit Type: Inpatient Procedure:                Upper GI endoscopy WITH COLD FORCEPS BIOPSY Indications:              Melena ON ASA/COUMADIN AND NO PPI. PT DOES NOT                            ADHERE TO DIET RESTRICTION FOR PT ON COUMADIN. LAST                            TCS JUN 2017: ONE 1.2 CM SIMPLE ADENOMA REMOVED. Providers:                Jonette Eva MD, MD, Nena Polio, RN, Ina Homes,                            Technician Referring MD:              Medicines:                Meperidine 25 mg IV, Midazolam 3 mg IV,                            Promethazine 12.5 mg IV Complications:            No immediate complications. Estimated Blood Loss:     Estimated blood loss was minimal. Procedure:                Pre-Anesthesia Assessment:                           - Prior to the procedure, a History and Physical                            was performed, and patient medications and                            allergies were reviewed. The patient's tolerance of                            previous anesthesia was also reviewed. The risks                            and benefits of the procedure and the sedation                            options and risks were discussed with the patient.                            All questions were answered, and informed consent                            was obtained. Prior Anticoagulants: The patient has  taken Coumadin (warfarin), last dose was 2 days                            prior to procedure. ASA Grade Assessment: II - A                            patient with mild systemic disease. After reviewing                            the risks and benefits, the patient was deemed in                            satisfactory condition to undergo the procedure.                           After obtaining informed consent, the endoscope was                            passed under direct vision. Throughout the                            procedure, the patient's blood pressure, pulse, and                            oxygen saturations were monitored continuously. The                            [id number] ([id number]) scope was introduced through the                            mouth, and advanced to the second part of duodenum.                            The upper GI endoscopy was accomplished without                            difficulty. The patient tolerated the procedure                            well. Scope In: 5:09:31 PM Scope Out: 5:18:46 PM Total Procedure Duration: 0 hours 9 minutes 15 seconds  Findings:      The examined esophagus was normal.      Diffuse moderate inflammation characterized by congestion (edema),       erosions and erythema was found in the entire examined stomach. Biopsies       were taken with a cold forceps for Helicobacter pylori testing.      A large non-bleeding diverticulum was found in the second portion of the       duodenum.      The duodenal bulb was normal. Impression:               - Normal esophagus.                           - MELENA MOST  LIKEY DUE TO MODERATE EROSIVE                            Gastritis IN SETTING OF SUPRA-THERAPEUTIC INR.                           - Non-bleeding duodenal diverticulum. Moderate Sedation:      Moderate (conscious) sedation was administered by the endoscopy nurse       and supervised by the endoscopist. The following parameters were       monitored: oxygen saturation, heart rate, blood pressure, and response       to care. Total physician intraservice time was 14 minutes. Recommendation:           - Cardiac diet.                           - Continue present medications. MAY RE-START                            COUMADIN AND STRICTLY CONTROL INR IF POSSIBLE. IF                             PT RE-BLEEDS HE WILL NEED AGILE CAPSULE STUDY                            FOLLOWED BY [employer] CAP[employer]LE.                           - Await pathology results. NO INDICATION FOR                            COLONOSCOPY AT THIS TIME.                           - Return patient to hospital ward for ongoing care.                           - Return to my office in 2 months. Procedure Code(s):        --- Professional ---                           [phone], Esophagogastroduodenoscopy, flexible,                            transoral; with biopsy, single or multiple                           99152, Moderate sedation services provided by the                            same physician or other qualified health care                            professional performing the diagnostic or  therapeutic service that the sedation supports,                            requiring the presence of an independent trained                            observer to assist in the monitoring of the                            patient's level of consciousness and physiological                            status; initial 15 minutes of intraservice time,                            patient age 26 years or older Diagnosis Code(s):        --- Professional ---                           K29.70, Gastritis, unspecified, without bleeding                           K92.1, Melena (includes Hematochezia)                           K57.10, Diverticulosis of small intestine without                            perforation or abscess without bleeding CPT copyright 2016 [employer]. All rights reserved. The codes documented in this report are preliminary and upon coder review may  be revised to meet current compliance requirements. Jonette Eva, MD Jonette Eva MD, MD 01/21/2017 5:35:23 PM This report has been signed electronically. Number of Addenda: 0

## 2017-01-21 NOTE — Interval H&P Note (Signed)
History and Physical Interval Note:  01/21/2017 4:52 PM  [name]  has presented today for surgery, with the diagnosis of melena  The various methods of treatment have been discussed with the patient and family. After consideration of risks, benefits and other options for treatment, the patient has consented to  Procedure(s): ESOPHAGOGASTRODUODENOSCOPY (EGD) (N/A) as a surgical intervention .  The patient's history has been reviewed, patient examined, no change in status, stable for surgery.  I have reviewed the patient's chart and labs.  Questions were answered to the patient's satisfaction.     [employer]

## 2017-01-21 NOTE — Consult Note (Addendum)
Referring Provider: No ref. provider found Primary Care Physician:  [place], Julie M, NP Primary Gastroenterologist:  Jonette Eva  Reason for Consultation:  GI BLEED   Impression: ADMITTED WITH MELENA AND ANEMIA. LAST BLACK STOOL YESTERDAY. TAKING ASA AND COUMADIN WITHOUT PPI AND A KNOWN HISTORY OF THROMBOCYTOPENIA. DIFFERENTIAL DIAGNOSIS INCLUDES: PUD, AVMs, DIEULAFOY'S LESION, LESS LIKELY GE JUNCTION TUMOR, OR GASTRIC CA.  Plan: 1. NPO EXCEPT SIPS WITH MEDS 2. PROTONIX 40 MV IV BID 3. EGD TODAY. DISCUSSED PROCEDURE, BENEFITS, & RISKS: < 1% chance of medication reaction, bleeding, perforation, or rupture of spleen/liver. NEEDS PHENERGAN/REGLAN 12.5 MG IV PRIOR TO EGD. 4. HOLD ASA. WILL NEED PPI DAILY INDEFINITELY WHEN RESTARTING ASA.      HPI:  PT IN HIS USUAL STATE OF HEALTH. REQUIRES ASA/COUMADIN DUE TO ASCVD. WAS ON ASA/COUMADIN AND NO PPI. HAD A HEAVY NOSEBLEED ONE MO AGO AND Hb NOTED TO BE 8.0. PRESENTED TO ED YESTERDAY WITH SUDDEN ONSET OF BLACK TARRY STOOLS YESTERDAY. IN ED BP STABLE AND Hb NOTED TO BE 6.1 AND INR 6.77.   PT DENIES FEVER, CHILLS, HEMATOCHEZIA, nausea, vomiting, diarrhea, CHEST PAIN, SHORTNESS OF BREATH,  CHANGE IN BOWEL IN HABITS, constipation, abdominal pain, problems swallowing, problems with sedation, OR heartburn or indigestion.  Past Medical History:  Diagnosis Date  . Blood clot in vein 2011   clot in right leg ,broke off into right lung  . Hypertension    pt. denies htn,was treated 10 yrs, ago,not on any meds now  . Pancreatitis, alcoholic     Past Surgical History:  Procedure Laterality Date  . 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 HIP SURGERY     right    Prior to Admission medications   Medication Sig Start Date End Date Taking? Authorizing Provider  albuterol (PROVENTIL HFA) 108 (90 Base) MCG/ACT inhaler Inhale 2 puffs into the lungs every 6 (six) hours as needed for wheezing or shortness of breath.    Yes [provider]  aspirin 81 MG chewable tablet Chew 81 mg by mouth daily.   Yes [provider]  gabapentin (NEURONTIN) 100 MG capsule Take 100 mg by mouth 3 (three) times daily as needed. For pain   Yes [provider]  gabapentin (NEURONTIN) 300 MG capsule Take 300 mg by mouth at bedtime. 12/30/16  Yes [provider]  levofloxacin (LEVAQUIN) 500 MG tablet Take 500 mg by mouth daily. 01/20/17  Yes [provider]  tamsulosin (FLOMAX) 0.4 MG CAPS capsule Take 0.4 mg by mouth daily.  07/21/15  Yes [provider]  traMADol (ULTRAM) 50 MG tablet Take 100 mg by mouth 2 (two) times daily. Pain   Yes [provider]  warfarin (COUMADIN) 10 MG tablet Take 1 tab as needed 01/09/17  Yes [provider]  warfarin (COUMADIN) 4 MG tablet Take 2 tablets (8 mg total) by mouth daily. Take 3 tablets (12 mg) today (05/19/15). Tomorrow 05/20/15, take your usual home dose of 8 mg (2 tablets). 05/19/15  Yes Maris Berger A, PA-C  rosuvastatin (CRESTOR) 10 MG tablet Take 1 tablet (10 mg total) by mouth daily. 05/19/15   Raymond Gurney, PA-C    Current Facility-Administered Medications  Medication Dose Route Frequency Provider Last Rate Last Dose  . 0.9 %  sodium chloride infusion   Intravenous Continuous Laural Benes, Clanford L, MD 60 mL/hr at 01/21/17 0752    . albuterol (PROVENTIL) (2.5 MG/3ML) 0.083% nebulizer solution 3 mL  3  mL Inhalation Q6H PRN Levie Heritage, DO      . [START ON 01/22/2017] azithromycin (ZITHROMAX) tablet 250 mg  250 mg Oral Daily Johnson, Clanford L, MD      . cefTRIAXone (ROCEPHIN) 1 g in dextrose 5 % 50 mL IVPB  1 g Intravenous Q24H Stinson, Jacob J, DO      . gabapentin (NEURONTIN) capsule 100 mg  100 mg Oral TID PRN Levie Heritage, DO      . gabapentin (NEURONTIN) capsule 300 mg  300 mg Oral QHS Levie Heritage, DO   300 mg at 01/20/17 2359  . pantoprazole (PROTONIX) injection 40 mg  40 mg Intravenous Q12H Levie Heritage, DO   40 mg at 01/20/17 2320  . tamsulosin (FLOMAX) capsule 0.4 mg  0.4 mg Oral Daily Levie Heritage, DO      . traMADol Janean Sark) tablet 100 mg  100 mg Oral BID Levie Heritage, DO   100 mg at 01/20/17 2359    Allergies as of 01/20/2017 - Review Complete 01/20/2017  Allergen Reaction Noted  . Sulfa antibiotics Itching and Rash 02/18/2011    Family History  Problem Relation Age of Onset  . Heart disease Sister   . Heart disease Brother      Social History   Social History  . Marital status: Married    Spouse name: N/A  . Number of children: N/A  . Years of education: N/A   Occupational History  . Not on file.   Social History Main Topics  . Smoking status: Former Smoker    Packs/day: 0.25    Years: 40.00    Types: Cigarettes    Start date: 09/26/1964    Quit date: 05/12/2015  . Smokeless tobacco: Never Used  . Alcohol use No  . Drug use: No  . Sexual activity: Not on file   Other Topics Concern  . Not on file   Social History Narrative  . No narrative on file    Review of Systems: PER HPI OTHERWISE ALL SYSTEMS ARE NEGATIVE.   Vitals: Blood pressure 106/61, pulse 70, temperature 98.7 F (37.1 C), temperature source Oral, resp. rate 16, height  (1.676 m), weight 145 lb 15.1 oz (66.2 kg), SpO2 94 %.  Physical Exam: General:   Alert,  Well-developed, well-nourished, pleasant and cooperative in NAD Head:  Normocephalic and atraumatic. Eyes:  Sclera clear, no icterus.   Conjunctiva pink. Mouth:  No lesions, dentition ABnormal. Neck:  Supple; no masses. Lungs:  Clear throughout to auscultation.   No wheezes. No acute distress. Heart:  Regular rate and rhythm; no murmurs, clicks, rubs,  or gallops. Abdomen:  Soft, nontender and nondistended. No masses noted. Normal bowel sounds, without guarding, and without rebound.   Msk:  Symmetrical without gross deformities. Normal posture. Extremities:  Without edema. Neurologic:  Alert and  oriented x4;   grossly normal neurologically. Cervical Nodes:  No significant cervical adenopathy. Psych:  Alert and cooperative. Normal mood and affect.  Lab Results:  Recent Labs  01/20/17 1828  WBC 4.1  HGB 6.1*  HCT 20.1*  PLT PLATELET CLUMPS NOTED ON SMEAR, COUNT APPEARS ADEQUATE   BMET  Recent Labs  01/20/17 1828  NA 135  K 3.9  CL 104  CO2 24  GLUCOSE 102*  BUN 20  CREATININE 0.75  CALCIUM 8.5*   LFT No results for input(s): PROT, ALBUMIN, AST, ALT, ALKPHOS, BILITOT, BILIDIR, IBILI in the last 72 hours.   Studies/Results:  JAN 2017:  CT ANGIO-1. Probable 7 mm enhancing polyp in the proximal transverse colon just beyond the hepatic flexure. Consider referral to gastroenterology for colonoscopy if 1 has not been recently performed. 2. Colonic diverticular disease without CT evidence of active inflammation.    LOS: 1 day   [facility]  01/21/2017, 8:09 AM

## 2017-01-21 NOTE — Progress Notes (Addendum)
Initial Nutrition Assessment  DOCUMENTATION CODES:  Not applicable  INTERVENTION:  Boost Breeze po BID, each supplement provides 250 kcal and 9 grams of protein when advanced to clear liquids   Ensure Enlive po BID, each supplement provides 350 kcal and 20 grams of protein when advanced to full liquids.   NUTRITION DIAGNOSIS:  Inadequate oral intake related to acute illness and Fatigue as evidenced by loss of 5% of bw in 1 month and 10% in 39month  GOAL:  Patient will meet greater than or equal to 90% of their needs  MONITOR:  PO intake, Supplement acceptance, Labs, Weight trends  REASON FOR ASSESSMENT:  Malnutrition Screening Tool    ASSESSMENT:  66y/o male PMHx DVT w/ PE, CAD,  PVD, HTN, s/p aortobifemoral bypass. Presents after being found to have a supra therapeutic INR. Had reported black stools over the past several days. Met Sepsis criteria, potentially from CAP. Admitted for management.   RD operating remotely. Per MST, patient has had unintentional wt loss as well as poor intake r//t a poor appetite.   He presented with general weakness and fatigue for a few weeks. There is no direct mention in notes about a decreased appetite or poor intake.   Per review of weight history via Care Everywhere. The patient was 167 lbs approximately 1 year ago. 6 months ago he was 160 lbs. 3 months ago was 159 lbs and 1 month ago he was 152.6 lbs. Using his admit weight, He has significant wt loss for the 6, 3 and 1 month intervals, with a loss of 11% bw, 10% and 5.6%, respectively.   His weight taken yesterday at his outpatient appointment was 142 lbs, lower than his admit weight.   Unable to diagnose with malnutrition until can see patient in person. Will follow up next week if still admitted.   Patient currently NPO, will order Boost breeze for when on CL and Ensure Enlive when advanced to full liquids.   NFPE: unable to conduct  Labs reviewed: H/H: 6.1/20.1 Meds: PPI, Ultram, IV  abx, IVF   Diet Order:  Diet NPO time specified Except for: Sips with Meds  Skin:  Reviewed, no issues  Last BM:  9/28  Height:  Ht Readings from Last 1 Encounters:  01/20/17 5' 6"  (1.676 m)   Weight:  Wt Readings from Last 1 Encounters:  01/21/17 145 lb 15.1 oz (66.2 kg)   Wt Readings from Last 10 Encounters:  01/21/17 145 lb 15.1 oz (66.2 kg)  01/19/17 143 lb (64.9 kg)  01/13/17 151 lb (68.5 kg)  01/14/16 164 lb (74.4 kg)  08/27/15 159 lb 6.4 oz (72.3 kg)  07/15/15 159 lb (72.1 kg)  06/11/15 162 lb (73.5 kg)  05/28/15 155 lb (70.3 kg)  05/26/15 159 lb (72.1 kg)  05/13/15 159 lb 1.6 oz (72.2 kg)   Ideal Body Weight:  64.55 kg  BMI:  Body mass index is 23.56 kg/m.  Estimated Nutritional Needs:  Kcal:  1850-2050 (29-32 kcal/kg bw) Protein:  84-97g Pro (1.3-1.5g/kg bw) Fluid:  >2 L (30 ml/kg bw)  EDUCATION NEEDS:  No education needs identified at this time  NBurtis JunesRD, LDN, [place]uss[place]villeNutrition Pager: [phone]/29/2018 9:24 AM

## 2017-01-22 ENCOUNTER — Encounter (HOSPITAL_COMMUNITY): Payer: Self-pay | Admitting: Family Medicine

## 2017-01-22 DIAGNOSIS — I739 Peripheral vascular disease, unspecified: Secondary | ICD-10-CM

## 2017-01-22 DIAGNOSIS — I959 Hypotension, unspecified: Secondary | ICD-10-CM

## 2017-01-22 DIAGNOSIS — R791 Abnormal coagulation profile: Secondary | ICD-10-CM

## 2017-01-22 DIAGNOSIS — K921 Melena: Secondary | ICD-10-CM

## 2017-01-22 LAB — CBC
HCT: 22.3 % — ABNORMAL LOW (ref 39.0–52.0)
Hemoglobin: 7.1 g/dL — ABNORMAL LOW (ref 13.0–17.0)
MCH: 29.1 pg (ref 26.0–34.0)
MCHC: 31.8 g/dL (ref 30.0–36.0)
MCV: 91.4 fL (ref 78.0–100.0)
PLATELETS: 198 10*3/uL (ref 150–400)
RBC: 2.44 MIL/uL — ABNORMAL LOW (ref 4.22–5.81)
RDW: 20 % — AB (ref 11.5–15.5)
WBC: 4.3 10*3/uL (ref 4.0–10.5)

## 2017-01-22 LAB — BASIC METABOLIC PANEL
Anion gap: 6 (ref 5–15)
BUN: 12 mg/dL (ref 6–20)
CALCIUM: 8 mg/dL — AB (ref 8.9–10.3)
CO2: 21 mmol/L — ABNORMAL LOW (ref 22–32)
CREATININE: 0.93 mg/dL (ref 0.61–1.24)
Chloride: 106 mmol/L (ref 101–111)
GFR calc Af Amer: >60 mL/min (ref >60)
GLUCOSE: 139 mg/dL — AB (ref 65–99)
POTASSIUM: 3.5 mmol/L (ref 3.5–5.1)
SODIUM: 133 mmol/L — AB (ref 135–145)

## 2017-01-22 LAB — PROTIME-INR
INR: 1.25
PROTHROMBIN TIME: 15.6 s — AB (ref 11.4–15.2)

## 2017-01-22 LAB — HIV ANTIBODY (ROUTINE TESTING W REFLEX): HIV Screen 4th Generation wRfx: NONREACTIVE

## 2017-01-22 LAB — PREPARE RBC (CROSSMATCH)

## 2017-01-22 LAB — PROCALCITONIN: PROCALCITONIN: 7.45 ng/mL

## 2017-01-22 MED ORDER — WARFARIN - PHARMACIST DOSING INPATIENT
Status: DC
  Administered 2017-01-22: 17:00:00

## 2017-01-22 MED ORDER — DOXYCYCLINE HYCLATE 100 MG PO TABS
100.0000 mg | ORAL_TABLET | Freq: Two times a day (BID) | ORAL | Status: DC
  Administered 2017-01-22 – 2017-01-23 (×3): 100 mg via ORAL
  Filled 2017-01-22 (×3): qty 1

## 2017-01-22 MED ORDER — WARFARIN SODIUM 5 MG PO TABS
5.0000 mg | ORAL_TABLET | Freq: Once | ORAL | Status: AC
  Administered 2017-01-22: 5 mg via ORAL
  Filled 2017-01-22: qty 1

## 2017-01-22 MED ORDER — SODIUM CHLORIDE 0.9 % IV SOLN
Freq: Once | INTRAVENOUS | Status: AC
  Administered 2017-01-22: 13:00:00 via INTRAVENOUS

## 2017-01-22 NOTE — Progress Notes (Signed)
ANTICOAGULATION CONSULT NOTE - Initial Consult  Pharmacy Consult for Coumadin Indication: VTE treatment  Allergies  Allergen Reactions  . Sulfa Antibiotics Itching and Rash    Patient Measurements: Height:  (167.6 cm) Weight: 145 lb 15.1 oz (66.2 kg) IBW/kg (Calculated) : 63.8 Heparin Dosing Weight:   Vital Signs: Temp: 97.8 F (36.6 C) (09/30 0800) Temp Source: Oral (09/30 0800) BP: 96/66 (09/30 0800) Pulse Rate: 81 (09/30 0800)  Labs:  Recent Labs  01/20/17 1828  01/21/17 0838 01/21/17 1436 01/21/17 2140 01/22/17 0421  HGB 6.1*  --  7.7*  --   --  7.1*  HCT 20.1*  --  23.6*  --   --  22.3*  PLT PLATELET CLUMPS NOTED ON SMEAR, COUNT APPEARS ADEQUATE  --  166  --   --  198  LABPROT 58.4*  < > 14.9 14.8 15.3* 15.6*  INR 6.77*  < > 1.18 1.17 1.22 1.25  CREATININE 0.75  --  0.73  --   --  0.93  < > = values in this interval not displayed.  Estimated Creatinine Clearance: 71.5 mL/min (by C-G formula based on SCr of 0.93 mg/dL).   Medical History: Past Medical History:  Diagnosis Date  . Blood clot in vein 2011   clot in right leg ,broke off into right lung  . Hypertension    pt. denies htn,was treated 10 yrs, ago,not on any meds now  . Pancreatitis, alcoholic     Medications:  Prescriptions Prior to Admission  Medication Sig Dispense Refill Last Dose  . albuterol (PROVENTIL HFA) 108 (90 Base) MCG/ACT inhaler Inhale 2 puffs into the lungs every 6 (six) hours as needed for wheezing or shortness of breath.   unknown  . aspirin 81 MG chewable tablet Chew 81 mg by mouth daily.   01/20/2017 at Unknown time  . gabapentin (NEURONTIN) 100 MG capsule Take 100 mg by mouth 3 (three) times daily as needed. For pain   01/20/2017 at Unknown time  . gabapentin (NEURONTIN) 300 MG capsule Take 300 mg by mouth at bedtime.  3 unknown  . levofloxacin (LEVAQUIN) 500 MG tablet Take 500 mg by mouth daily.   unknown  . tamsulosin (FLOMAX) 0.4 MG CAPS capsule Take 0.4 mg by mouth  daily.    01/19/2017 at Unknown time  . traMADol (ULTRAM) 50 MG tablet Take 100 mg by mouth 2 (two) times daily. Pain   01/20/2017 at Unknown time  . warfarin (COUMADIN) 10 MG tablet Take 1 tab as needed   01/19/2017 at Unknown time  . warfarin (COUMADIN) 4 MG tablet Take 2 tablets (8 mg total) by mouth daily. Take 3 tablets (12 mg) today (05/19/15). Tomorrow 05/20/15, take your usual home dose of 8 mg (2 tablets). 30 tablet 0 01/19/2017 at Unknown time  . rosuvastatin (CRESTOR) 10 MG tablet Take 1 tablet (10 mg total) by mouth daily. 30 tablet 11 Taking    Assessment: 66 yo male history of DVT, PTA on warfarin. Admitted for supra therapeutic INR (6.77) and GI bleed., Vit K and Kcentra, PRBC  given in ED.  GI consult, melena most likely due to moderate erosive gastritis in setting of supra-therapeutic INR. May restart Coumadin and strictly control INR if possible.   Goal of Therapy:  INR 2-3 Monitor platelets by anticoagulation protocol: Yes   Plan:  Coumadin 5 mg po x 1 dose today (lower dose due to admitted for elevated INR) INR/PT daily Monitor CBC  Raquel James, Jesenya Bowditch Bennett 01/22/2017,10:03 AM

## 2017-01-22 NOTE — Progress Notes (Addendum)
Patient ID: [name], male   DOB: [birth year], 66 y.o.   MRN: [id number] .  Assessment/Plan: ADMITTED WITH MELENA IN SETTING OF SUPRA-THERAPEUTIC INR. EGD REVEALED EROSIVE GASTRITIS. CLINICALLY IMPROVED. NO BRBPR OR MELENA BUT Hb 7.1.  PLAN: 1. SUPPORTIVE CARE 2. PROTONIX BID 3. MAY RE-START COUMADIN 4. AWAIT PATH REPORT 5. IF DEVELOPS MELENA AFTER RE-STARTING COUMADIN, CONSIDER EGD TO PLACE GIVENS CAPSULE TO RELEASE CAPSULE DISTAL TO DUODENAL DIVERTICULUM   Subjective: Since I last evaluated the patient HE DENIES ABDOMINAL PAIN. NO BRBPR OR MELENA.TOLERATING POs.  Objective: Vital signs in last 24 hours: Vitals:   01/22/17 0800 01/22/17 0819  BP: 96/66   Pulse: 81   Resp: 20   Temp: 97.8 F (36.6 C)   SpO2: 95% 98%   General appearance: alert, cooperative and no distress Resp: clear to auscultation bilaterally Cardio: regular rate and rhythm GI: soft, non-tender; bowel sounds normal;   Lab Results:  Hb 7.7-->7.1 CR 0.93 K 3.5   Studies/Results: No results found.  Medications: I have reviewed the patient's current medications.   LOS: 5 days   Jonette Eva 10/03/2013, 2:23 PM

## 2017-01-22 NOTE — Progress Notes (Signed)
PROGRESS NOTE  [name]  ZOX:[id number]  DOB: 03/29/1951  DOA: 01/20/2017 PCP: Grant, Martin M, NP  Brief Admission Hx: [name] is a 66 y.o. male with a history of DVT with PE, coronary artery disease, hypertension, status post aortobifemoral bypass grafts. Patient admitted for elevated INR, GI bleeding, Anemia and possible pneumonia.   MDM/Assessment & Plan:   1. GI Hemorrhage - EGD revealed moderate erosive gastritis thought to be cause of bleeding in setting of elevated INR.  He is now on protonix BID, restarted warfarin per GI.  He is hemodynamically stabilized and followed closely in SDU.  Advanced diet and tolerating well. Transfer to telemetry.  2. Acute blood loss anemia - s/p 2 units PRBCs. Hg down to 7.1 today, will transfuse additional 1 unit PRBC 9/30.  Cbc in AM.  3. Supratherapeutic INR - Treated with Vit K and now normalized.  Resume warfarin per pharmacy, try to keep INR tightly in range per GI.  4. Community Acquired Pneumonia - oral doxycycline ordered.   5. History of DVT - resume warfarin per pharmacy.    DVT prophylaxis: SCDs Code Status: FULL  Family Communication: bedside Disposition Plan: Hopefully home tomorrow with [facility] RN for PT/INR testing  Consultants:  GI  Subjective: Pt feels much better   Objective: Vitals:   01/22/17 1100 01/22/17 1200 01/22/17 1302 01/22/17 1317  BP: 94/63 113/71 (!) 93/50 (!) 103/59  Pulse: 76 90 71 74  Resp: 17 (!) 25 (!) 23 20  Temp:  98.5 F (36.9 C) (!) 97.5 F (36.4 C) (!) 97.5 F (36.4 C)  TempSrc:  Oral Oral Oral  SpO2: 99% 96% 100% 100%  Weight:      Height:        Intake/Output Summary (Last 24 hours) at 01/22/17 1448 Last data filed at 01/22/17 1302  Gross per 24 hour  Intake              500 ml  Output                0 ml  Net              500 ml   Filed Weights   01/20/17 1641 01/20/17 2229 01/21/17 0406  Weight: 64.9 kg (143 lb) 64.8 kg (142 lb 13.7 oz) 66.2 kg (145 lb 15.1 oz)   REVIEW  OF SYSTEMS  As per history otherwise all reviewed and reported negative  Exam:  General exam: awake, alert, NAD.  Respiratory system: Clear. No increased work of breathing. Cardiovascular system: S1 & S2 heard, RRR. Gastrointestinal system: Abdomen is nondistended, soft and nontender. Normal bowel sounds heard. Central nervous system: Alert and oriented. No focal neurological deficits. Extremities: no CCE.  Data Reviewed: Basic Metabolic Panel:  Recent Labs Lab 01/20/17 1828 01/21/17 0838 01/22/17 0421  NA 135 132* 133*  K 3.9 3.9 3.5  CL 104 102 106  CO2 24 20* 21*  GLUCOSE 102* 99 139*  BUN CREATININE 0.75 0.73 0.93  CALCIUM 8.5* 8.2* 8.0*   Liver Function Tests:  Recent Labs Lab 01/21/17 0838  AST 12*  ALT 10*  ALKPHOS 53  BILITOT 2.0*  PROT 6.0*  ALBUMIN 2.7*   No results for input(s): LIPASE, AMYLASE in the last 168 hours. No results for input(s): AMMONIA in the last 168 hours. CBC:  Recent Labs Lab 01/20/17 1828 01/21/17 0838 01/22/17 0421  WBC 4.1 3.9* 4.3  NEUTROABS 2.2  --   --  HGB 6.1* 7.7* 7.1*  HCT 20.1* 23.6* 22.3*  MCV 90.5 90.4 91.4  PLT PLATELET CLUMPS NOTED ON SMEAR, COUNT APPEARS ADEQUATE 166 198   Cardiac Enzymes: No results for input(s): CKTOTAL, CKMB, CKMBINDEX, TROPONINI in the last 168 hours. CBG (last 3)  No results for input(s): GLUCAP in the last 72 hours. Recent Results (from the past 240 hour(s))  Blood culture (routine x 2)     Status: None (Preliminary result)   Collection Time: 01/20/17  8:30 PM  Result Value Ref Range Status   Specimen Description BLOOD RIGHT ARM  Final   Special Requests   Final    Blood Culture results may not be optimal due to an inadequate volume of blood received in culture bottles   Culture NO GROWTH 2 DAYS  Final   Report Status PENDING  Incomplete  MRSA PCR Screening     Status: None   Collection Time: 01/20/17 10:25 PM  Result Value Ref Range Status   MRSA by PCR NEGATIVE  NEGATIVE Final    Comment:        The GeneXpert MRSA Assay (FDA approved for NASAL specimens only), is one component of a comprehensive MRSA colonization surveillance program. It is not intended to diagnose MRSA infection nor to guide or monitor treatment for MRSA infections.   Blood culture (routine x 2)     Status: None (Preliminary result)   Collection Time: 01/20/17 11:24 PM  Result Value Ref Range Status   Specimen Description BLOOD RIGHT HAND  Final   Special Requests   Final    BOTTLES DRAWN AEROBIC AND ANAEROBIC Blood Culture adequate volume   Culture PENDING  Incomplete   Report Status PENDING  Incomplete     Studies: Dg Chest Portable 1 View  Result Date: 01/20/2017 CLINICAL DATA:  Generalized weakness.  Nose bleed. EXAM: PORTABLE CHEST 1 VIEW COMPARISON:  Chest radiograph May 14, 2015 FINDINGS: Cardiac silhouette is mildly enlarged and unchanged. Mediastinal silhouette is nonsuspicious. No pleural effusion or focal consolidation. No pneumothorax. Interval removal of life-support lines. Soft tissue planes and included osseous structures are nonsuspicious. Old LEFT rib fractures. IMPRESSION: Mild cardiomegaly.  No acute pulmonary process. Electronically Signed   By: Awilda Metro M.D.   On: 01/20/2017 20:30   Scheduled Meds: . doxycycline  100 mg Oral Q12H  . feeding supplement  1 Container Oral BID BM  . feeding supplement (ENSURE ENLIVE)  237 mL Oral BID BM  . gabapentin  300 mg Oral QHS  . pantoprazole  40 mg Oral BID AC  . tamsulosin  0.4 mg Oral Daily  . traMADol  100 mg Oral BID  . warfarin  5 mg Oral Once  . Warfarin - Pharmacist Dosing Inpatient   Does not apply Q24H   Continuous Infusions:  Principal Problem:   Sepsis (HCC) Active Problems:   DVT of leg (deep venous thrombosis) (HCC)   PAD (peripheral artery disease) (HCC)   CAP (community acquired pneumonia)   Supratherapeutic INR   Upper GI bleeding   Acute blood loss anemia  Critical Care  Time spent: 31 mins  Standley Dakins, MD, FAAFP [facility] Pager [phone]  If 7PM-7AM, please contact night-coverage [email or web address] Password [email or web address] 01/22/2017, 2:48 PM    LOS: 2 days

## 2017-01-23 ENCOUNTER — Telehealth: Payer: Self-pay | Admitting: Gastroenterology

## 2017-01-23 ENCOUNTER — Encounter (HOSPITAL_COMMUNITY): Payer: Self-pay | Admitting: Gastroenterology

## 2017-01-23 DIAGNOSIS — D649 Anemia, unspecified: Secondary | ICD-10-CM

## 2017-01-23 DIAGNOSIS — A419 Sepsis, unspecified organism: Secondary | ICD-10-CM

## 2017-01-23 DIAGNOSIS — K922 Gastrointestinal hemorrhage, unspecified: Secondary | ICD-10-CM

## 2017-01-23 DIAGNOSIS — R791 Abnormal coagulation profile: Secondary | ICD-10-CM

## 2017-01-23 DIAGNOSIS — K921 Melena: Secondary | ICD-10-CM

## 2017-01-23 LAB — CBC
HCT: 25.3 % — ABNORMAL LOW (ref 39.0–52.0)
Hemoglobin: 7.9 g/dL — ABNORMAL LOW (ref 13.0–17.0)
MCH: 28.1 pg (ref 26.0–34.0)
MCHC: 31.2 g/dL (ref 30.0–36.0)
MCV: 90 fL (ref 78.0–100.0)
PLATELETS: 153 10*3/uL (ref 150–400)
RBC: 2.81 MIL/uL — ABNORMAL LOW (ref 4.22–5.81)
RDW: 19.8 % — ABNORMAL HIGH (ref 11.5–15.5)
WBC: 5.1 10*3/uL (ref 4.0–10.5)

## 2017-01-23 LAB — BPAM RBC
BLOOD PRODUCT EXPIRATION DATE: 201810192359
Blood Product Expiration Date: 201810162359
Blood Product Expiration Date: 201810252359
ISSUE DATE / TIME: 201809290338
ISSUE DATE / TIME: 201809290042
ISSUE DATE / TIME: 201809301248
UNIT TYPE AND RH: 1700
Unit Type and Rh: 1700
Unit Type and Rh: 5100

## 2017-01-23 LAB — TYPE AND SCREEN
ABO/RH(D): B POS
ANTIBODY SCREEN: NEGATIVE
UNIT DIVISION: 0
UNIT DIVISION: 0
Unit division: 0

## 2017-01-23 LAB — PROTIME-INR
INR: 1.31
PROTHROMBIN TIME: 16.2 s — AB (ref 11.4–15.2)

## 2017-01-23 MED ORDER — DOXYCYCLINE HYCLATE 100 MG PO TABS: 100.0000 mg | ORAL_TABLET | Freq: Two times a day (BID) | ORAL | 0 refills | Status: AC

## 2017-01-23 MED ORDER — PANTOPRAZOLE SODIUM 40 MG PO TBEC: 40.0000 mg | DELAYED_RELEASE_TABLET | Freq: Two times a day (BID) | ORAL | 0 refills | Status: DC

## 2017-01-23 MED ORDER — WARFARIN SODIUM 4 MG PO TABS: 8.0000 mg | ORAL_TABLET | Freq: Every day | ORAL | 0 refills | Status: DC

## 2017-01-23 MED ORDER — WARFARIN SODIUM 4 MG PO TABS: 4.0000 mg | ORAL_TABLET | Freq: Every day | ORAL | 0 refills | Status: AC

## 2017-01-23 MED ORDER — BOOST / RESOURCE BREEZE PO LIQD: 1.0000 | Freq: Two times a day (BID) | ORAL | 0 refills | Status: AC

## 2017-01-23 NOTE — Progress Notes (Signed)
Patient's IV sites (3) removed.  Sites WNL.  AVS reviewed with patient.  Verbalized understanding of discharge instructions, physician follow-up, medications.  Home Health arranged for nurse and PT/INR testing at home.  Patient transported by NT via wheelchair to main entrance at discharge.  Patient stable at time of discharge.  Dr. Laural Benes changed Warfarin to 4 mg once a day at discharge.  RN called and notified patient.  Patient verbalized understanding of Warfarin instructions.

## 2017-01-23 NOTE — Progress Notes (Signed)
REVIEWED-NO ADDITIONAL RECOMMENDATIONS. OPV IN 2 MOS. CBC IN 2 WEEKS.   Subjective: Feels better today. Some mild weakness in his hands. He thinks that is due to repeated lab draws. Had a bowel movement this morning with no blood noted. Overall feels well. Denies overt abdominal pain, nausea, vomiting. No other GI symptoms. Denies chest pain, shortness of breath, dizziness.  Objective: Vital signs in last 24 hours: Temp:  [97.5 F (36.4 C)-100.3 F (37.9 C)] 98.6 F (37 C) (10/01 0510) Pulse Rate:  [71-90] 80 (10/01 0510) Resp:  [16-25] 18 (10/01 0510) BP: (93-113)/(48-71) 107/55 (10/01 0510) SpO2:  [95 %-100 %] 95 % (10/01 0510) Last BM Date: 01/20/17 General:   Alert and oriented, pleasant Head:  Normocephalic and atraumatic. Eyes:  No icterus, sclera clear. Conjuctiva pink.  Heart:  S1, S2 present, no murmurs noted.  Lungs: Clear to auscultation bilaterally, without wheezing, rales, or rhonchi.  Abdomen:  Bowel sounds present, soft, non-tender, non-distended. No HSM or hernias noted. No rebound or guarding. No masses appreciated  Msk:  Symmetrical without gross deformities. Pulses:  Normal bilateral DP pulses noted. Extremities:  Without clubbing or edema. Neurologic:  Alert and  oriented x4;  grossly normal neurologically. Psych:  Alert and cooperative. Normal mood and affect.  Intake/Output from previous day: 09/30 0701 - 10/01 0700 In: 1079.5 [P.O.:240; I.V.:524.5; Blood:315] Out: -  Intake/Output this shift: No intake/output data recorded.  Lab Results:  Recent Labs  01/21/17 0838 01/22/17 0421 01/23/17 0526  WBC 3.9* 4.3 5.1  HGB 7.7* 7.1* 7.9*  HCT 23.6* 22.3* 25.3*  PLT 166 198 153   BMET  Recent Labs  01/20/17 1828 01/21/17 0838 01/22/17 0421  NA 135 132* 133*  K 3.9 3.9 3.5  CL 104 102 106  CO2 24 20* 21*  GLUCOSE 102* 99 139*  BUN CREATININE 0.75 0.73 0.93  CALCIUM 8.5* 8.2* 8.0*   LFT  Recent Labs  01/21/17 0838  PROT 6.0*   ALBUMIN 2.7*  AST 12*  ALT 10*  ALKPHOS 53  BILITOT 2.0*   PT/INR  Recent Labs  01/22/17 0421 01/23/17 0526  LABPROT 15.6* 16.2*  INR 1.25 1.31   Hepatitis Panel No results for input(s): HEPBSAG, HCVAB, HEPAIGM, HEPBIGM in the last 72 hours.   Studies/Results: No results found.  Assessment: Admitted with melena and heme+ stools in the setting of supra-therapeutic INR 6.77 on admission now down to 1.31 today). EGD found normal esophagus, melena likely due to moderate erosive gastritis in the setting of supra-therapeutic INR, non-bleeding duodenal diverticulum. Recommended continue current medications, OV follow-up in 2 months, can restart coumadin; if further bleeding/decline in hgb consider agile capsule study followed by Givens capsule. Avoid all ASA/NSAIDs.  His hgb on admission was 6.1. Yestercay it was 7.1, this morning is up to 7.9.   Overall he is clinically improved today. His INR is normalized and his hemoglobin has improved. Given no further obvious bleeding, improvement in hemoglobin we can hold off for now on agile capsule/Givens capsule. Continue to monitor. Discussed strict avoidance of all NSAIDs and aspirin products. The patient verbalized understanding. Recommend continue PPI for a minimum of 3 months twice a day.  Plan: 1. Continue PPI twice a day for at least 3 months. 2. Continue to monitor for any recurrent GI bleed. 3. Follow hemoglobin closely. 4. Supportive measures.   Thank you for allowing [place] to participate in the care of [name]  Wynne Dust, DNP, AGNP-C Adult &  Gerontological Nurse Practitioner [facility]     LOS: 3 days    01/23/2017, 8:21 AM

## 2017-01-23 NOTE — Telephone Encounter (Signed)
CBC IN 2 WEEKS.  OPV IN 2 MOS E30 ANEMIA/UGIB.

## 2017-01-23 NOTE — Discharge Summary (Addendum)
Physician Discharge Summary  CHAYTON MURATA ZOX:[id number] DOB: 01-07-1951 DOA: 01/20/2017  PCP: [place], Julie M, NP GI: Dr. Jonette Eva  Admit date: 01/20/2017 Discharge date: 01/23/2017  Admitted From: Home  Disposition: Home   Recommendations for Outpatient Follow-up:  1. Follow up with PCP in 1 weeks, recheck PT/INR 2. Please obtain BMP/CBC/PT/INR on follow up  3. Please tightly monitor PT/INR to keep in 2-3 range.    Home Health: RN   Discharge Condition: STABLE   CODE STATUS: FULL    Brief Hospitalization Summary: Please see all hospital notes, images, labs for full details of the hospitalization.  HPI: Manveer A Fermin is a 66 y.o. male with a history of DVT with PE, coronary artery disease, hypertension, status post aortobifemoral bypass grafts. Patient seen for elevated INR. Patient has been having a cough for the past couple of weeks, which has been worsening with. Sputum production. Patient saw primary care physician yesterday and was given a prescription for Levaquin. His INR was also checked. This evening, the patient got a call from his primary care physician who instructed him to go to the hospital due to supratherapeutic INR. Patient has been having black stools over the past several days. No palliating or provoking factors. No sick contacts. Patient is a smoker.  Emergency Department Course: 2 units packed red blood cells ordered. INR 6.7. Patient's blood pressure was in the 90s systolically. IV fluids started rectal temp elevated. Started on Rocephin. Fluid resuscitation given in ED.   Brief Admission Hx: Ricco A Hairstonis a 66 y.o.malewith a history of DVT with PE, coronary artery disease, hypertension, status post aortobifemoral bypass grafts. Patient admitted for elevated INR, GI bleeding, Anemia and possible pneumonia.   MDM/Assessment & Plan:   1. GI Hemorrhage - EGD revealed moderate erosive gastritis thought to be cause of bleeding in setting of elevated INR.   He is now on protonix BID, restarted warfarin per GI.  He is hemodynamically stabilized and followed closely in SDU.  Advanced diet and tolerating well.  HHRN arranged to check PT/INR in 2 days and report to PCP.  Discharge on warfarin 4 mg daily.   2. Acute blood loss anemia - s/p 2 units PRBCs. Hg down to 7.1 today, will transfuse additional 1 unit PRBC 9/30.  Cbc in AM.  3. Supratherapeutic INR - Treated with Vit K and now normalized.  Resume warfarin per pharmacy, try to keep INR tightly in range per GI.  4. Community Acquired Pneumonia - oral doxycycline ordered.   5. History of DVT - resume warfarin per pharmacy.    DVT prophylaxis: SCDs Code Status: FULL  Family Communication: bedside Disposition Plan: Home HH RN for PT/INR testing  Consultants:  GI  Discharge Diagnoses:  Principal Problem:   Sepsis (HCC) Active Problems:   DVT of leg (deep venous thrombosis) (HCC)   PAD (peripheral artery disease) (HCC)   CAP (community acquired pneumonia)   Supratherapeutic INR   Upper GI bleeding   Acute blood loss anemia   Elevated INR   Melena   Transient hypotension   Symptomatic anemia  Discharge Instructions: Discharge Instructions    Call MD for:  difficulty breathing, headache or visual disturbances    Complete by:  As directed    Call MD for:  extreme fatigue    Complete by:  As directed    Call MD for:  hives    Complete by:  As directed    Call MD for:  persistant dizziness or  light-headedness    Complete by:  As directed    Call MD for:  persistant nausea and vomiting    Complete by:  As directed    Call MD for:  severe uncontrolled pain    Complete by:  As directed    Increase activity slowly    Complete by:  As directed      Allergies as of 01/23/2017      Reactions   Sulfa Antibiotics Itching, Rash      Medication List    STOP taking these medications   aspirin 81 MG chewable tablet   levofloxacin 500 MG tablet Commonly known as:  LEVAQUIN      TAKE these medications   doxycycline 100 MG tablet Commonly known as:  VIBRA-TABS Take 1 tablet (100 mg total) by mouth every 12 (twelve) hours.   feeding supplement Liqd Take 1 Container by mouth 2 (two) times daily between meals.   gabapentin 100 MG capsule Commonly known as:  NEURONTIN Take 100 mg by mouth 3 (three) times daily as needed. For pain   gabapentin 300 MG capsule Commonly known as:  NEURONTIN Take 300 mg by mouth at bedtime.   pantoprazole 40 MG tablet Commonly known as:  PROTONIX Take 1 tablet (40 mg total) by mouth 2 (two) times daily before a meal.   PROVENTIL HFA 108 (90 Base) MCG/ACT inhaler Generic drug:  albuterol Inhale 2 puffs into the lungs every 6 (six) hours as needed for wheezing or shortness of breath.   rosuvastatin 10 MG tablet Commonly known as:  CRESTOR Take 1 tablet (10 mg total) by mouth daily.   tamsulosin 0.4 MG Caps capsule Commonly known as:  FLOMAX Take 0.4 mg by mouth daily.   traMADol 50 MG tablet Commonly known as:  ULTRAM Take 100 mg by mouth 2 (two) times daily. Pain   warfarin 4 MG tablet Commonly known as:  COUMADIN Take 1 tablet (4 mg total) by mouth daily. What changed:  how much to take  additional instructions  Another medication with the same name was removed. Continue taking this medication, and follow the directions you see here.      Follow-up Information    [place], Julie M, NP Follow up on 01/30/2017.   Specialty:  Nurse Practitioner Why:  at 11:45 am (check PT/INR). Please arrive at 11:30 am 1. Follow up with PCP in 1 week, recheck PT/INR 2. Please obtain BMP/CBC/PT/INR on follow up  3. Please tightly monitor PT/INR to keep in 2-3 range. Contact information: [address] [place] [address] [phone]        [place], MD Follow up on 03/28/2017.   Specialty:  Gastroenterology Why:  at 10:30 am Contact information: [address] [place]  [address] [phone]        [facility] Follow up.   Why:  home health RN for PT/INR lab draws Contact information: [address] [place] [address] [phone]         Allergies  Allergen Reactions  . Sulfa Antibiotics Itching and Rash   Discharge Medication List as of 01/23/2017 12:41 PM    START taking these medications   Details  doxycycline (VIBRA-TABS) 100 MG tablet Take 1 tablet (100 mg total) by mouth every 12 (twelve) hours., Starting Mon 01/23/2017, Until Sat 01/28/2017, Normal    feeding supplement (BOOST / RESOURCE BREEZE) LIQD Take 1 Container by mouth 2 (two) times daily between meals., Starting  Mon 01/23/2017, Until Mon 02/06/2017, Normal    pantoprazole (PROTONIX) 40 MG tablet Take 1 tablet (40 mg total) by mouth 2 (two) times daily before a meal., Starting Mon 01/23/2017, Until Sun 04/23/2017, Normal      CONTINUE these medications which have CHANGED   Details  warfarin (COUMADIN) 4 MG tablet Take 2 tablets (8 mg total) by mouth daily., Starting Mon 01/23/2017, No Print      CONTINUE these medications which have NOT CHANGED   Details  albuterol (PROVENTIL HFA) 108 (90 Base) MCG/ACT inhaler Inhale 2 puffs into the lungs every 6 (six) hours as needed for wheezing or shortness of breath., Until Discontinued, Historical Med    !! gabapentin (NEURONTIN) 100 MG capsule Take 100 mg by mouth 3 (three) times daily as needed. For pain, Until Discontinued, Historical Med    !! gabapentin (NEURONTIN) 300 MG capsule Take 300 mg by mouth at bedtime., Starting Fri 12/30/2016, Historical Med    tamsulosin (FLOMAX) 0.4 MG CAPS capsule Take 0.4 mg by mouth daily. , Starting Tue 07/21/2015, Historical Med    traMADol (ULTRAM) 50 MG tablet Take 100 mg by mouth 2 (two) times daily. Pain, Until Discontinued, Historical Med    rosuvastatin (CRESTOR) 10 MG tablet Take 1 tablet (10 mg total) by mouth daily., Starting Tue 05/19/2015, Print     !! - Potential  duplicate medications found. Please discuss with provider.    STOP taking these medications     aspirin 81 MG chewable tablet      levofloxacin (LEVAQUIN) 500 MG tablet        Procedures/Studies: Dg Ankle Complete Left  Result Date: 01/13/2017 CLINICAL DATA:  Left foot and ankle pain after blunt trauma yesterday. EXAM: LEFT ANKLE COMPLETE - 3+ VIEW COMPARISON:  None. FINDINGS: There is no evidence of fracture, dislocation, or joint effusion. There is no evidence of arthropathy or other focal bone abnormality. Soft tissues are unremarkable. IMPRESSION: Negative. Electronically Signed   By: Elberta Fortis M.D.   On: 01/13/2017 21:19   Dg Chest Portable 1 View  Result Date: 01/20/2017 CLINICAL DATA:  Generalized weakness.  Nose bleed. EXAM: PORTABLE CHEST 1 VIEW COMPARISON:  Chest radiograph May 14, 2015 FINDINGS: Cardiac silhouette is mildly enlarged and unchanged. Mediastinal silhouette is nonsuspicious. No pleural effusion or focal consolidation. No pneumothorax. Interval removal of life-support lines. Soft tissue planes and included osseous structures are nonsuspicious. Old LEFT rib fractures. IMPRESSION: Mild cardiomegaly.  No acute pulmonary process. Electronically Signed   By: Awilda Metro M.D.   On: 01/20/2017 20:30   Dg Foot Complete Left  Result Date: 01/13/2017 CLINICAL DATA:  Blunt trauma to top of left foot and ankle yesterday. EXAM: LEFT FOOT - COMPLETE 3+ VIEW COMPARISON:  None. FINDINGS: Health valgus deformity with mild degenerative change of the first MTP joint. Old distal second and third metatarsal fractures. No acute fracture or dislocation. IMPRESSION: No acute fracture. Electronically Signed   By: Elberta Fortis M.D.   On: 01/13/2017 21:20     Subjective: Pt says he feels much better today.    Discharge Exam: Vitals:   01/22/17 2113 01/23/17 0510  BP: (!) 101/48 (!) 107/55  Pulse: 80 80  Resp: 18 18  Temp: 100.3 F (37.9 C) 98.6 F (37 C)  SpO2: 95% 95%    Vitals:   01/22/17 1515 01/22/17 1628 01/22/17 2113 01/23/17 0510  BP: (!) 109/54 (!) 103/56 (!) 101/48 (!) 107/55  Pulse: 79 76 80 80  Resp: 18  Temp: (!) 97.5 F (36.4 C) 97.9 F (36.6 C) 100.3 F (37.9 C) 98.6 F (37 C)  TempSrc: Oral Oral Oral Oral  SpO2: 99% 98% 95% 95%  Weight:      Height:       General: Pt is alert, awake, not in acute distress Cardiovascular: RRR, S1/S2 +, no rubs, no gallops Respiratory: CTA bilaterally, no wheezing, no rhonchi Abdominal: Soft, NT, ND, bowel sounds + Extremities: no edema, no cyanosis   The results of significant diagnostics from this hospitalization (including imaging, microbiology, ancillary and laboratory) are listed below for reference.     Microbiology: Recent Results (from the past 240 hour(s))  Blood culture (routine x 2)     Status: None (Preliminary result)   Collection Time: 01/20/17  8:30 PM  Result Value Ref Range Status   Specimen Description BLOOD RIGHT ARM  Final   Special Requests   Final    BOTTLES DRAWN AEROBIC ONLY Blood Culture results may not be optimal due to an inadequate volume of blood received in culture bottles   Culture NO GROWTH 3 DAYS  Final   Report Status PENDING  Incomplete  MRSA PCR Screening     Status: None   Collection Time: 01/20/17 10:25 PM  Result Value Ref Range Status   MRSA by PCR NEGATIVE NEGATIVE Final    Comment:        The GeneXpert MRSA Assay (FDA approved for NASAL specimens only), is one component of a comprehensive MRSA colonization surveillance program. It is not intended to diagnose MRSA infection nor to guide or monitor treatment for MRSA infections.   Blood culture (routine x 2)     Status: None (Preliminary result)   Collection Time: 01/20/17 11:24 PM  Result Value Ref Range Status   Specimen Description BLOOD RIGHT HAND  Final   Special Requests   Final    BOTTLES DRAWN AEROBIC AND ANAEROBIC Blood Culture adequate volume   Culture PENDING  Incomplete    Report Status PENDING  Incomplete     Labs: BNP (last 3 results) No results for input(s): BNP in the last 8760 hours. Basic Metabolic Panel:  Recent Labs Lab 01/20/17 1828 01/21/17 0838 01/22/17 0421  NA 135 132* 133*  K 3.9 3.9 3.5  CL 104 102 106  CO2 24 20* 21*  GLUCOSE 102* 99 139*  BUN CREATININE 0.75 0.73 0.93  CALCIUM 8.5* 8.2* 8.0*   Liver Function Tests:  Recent Labs Lab 01/21/17 0838  AST 12*  ALT 10*  ALKPHOS 53  BILITOT 2.0*  PROT 6.0*  ALBUMIN 2.7*   No results for input(s): LIPASE, AMYLASE in the last 168 hours. No results for input(s): AMMONIA in the last 168 hours. CBC:  Recent Labs Lab 01/20/17 1828 01/21/17 0838 01/22/17 0421 01/23/17 0526  WBC 4.1 3.9* 4.3 5.1  NEUTROABS 2.2  --   --   --   HGB 6.1* 7.7* 7.1* 7.9*  HCT 20.1* 23.6* 22.3* 25.3*  MCV 90.5 90.4 91.4 90.0  PLT PLATELET CLUMPS NOTED ON SMEAR, COUNT APPEARS ADEQUATE 166 198 153   Cardiac Enzymes: No results for input(s): CKTOTAL, CKMB, CKMBINDEX, TROPONINI in the last 168 hours. BNP: Invalid input(s): POCBNP CBG: No results for input(s): GLUCAP in the last 168 hours. D-Dimer No results for input(s): DDIMER in the last 72 hours. Hgb A1c No results for input(s): HGBA1C in the last 72 hours. Lipid Profile No results for input(s): CHOL, HDL,  LDLCALC, TRIG, CHOLHDL, LDLDIRECT in the last 72 hours. Thyroid function studies No results for input(s): TSH, T4TOTAL, T3FREE, THYROIDAB in the last 72 hours.  Invalid input(s): FREET3 Anemia work up No results for input(s): VITAMINB12, FOLATE, FERRITIN, TIBC, IRON, RETICCTPCT in the last 72 hours. Urinalysis    Component Value Date/Time   COLORURINE YELLOW 01/21/2017 0429   APPEARANCEUR CLEAR 01/21/2017 0429   LABSPEC 1.020 01/21/2017 0429   PHURINE 5.0 01/21/2017 0429   GLUCOSEU NEGATIVE 01/21/2017 0429   HGBUR NEGATIVE 01/21/2017 0429   BILIRUBINUR NEGATIVE 01/21/2017 0429   KETONESUR NEGATIVE 01/21/2017 0429    PROTEINUR NEGATIVE 01/21/2017 0429   NITRITE NEGATIVE 01/21/2017 0429   LEUKOCYTESUR NEGATIVE 01/21/2017 0429   Sepsis Labs Invalid input(s): PROCALCITONIN,  WBC,  LACTICIDVEN Microbiology Recent Results (from the past 240 hour(s))  Blood culture (routine x 2)     Status: None (Preliminary result)   Collection Time: 01/20/17  8:30 PM  Result Value Ref Range Status   Specimen Description BLOOD RIGHT ARM  Final   Special Requests   Final    BOTTLES DRAWN AEROBIC ONLY Blood Culture results may not be optimal due to an inadequate volume of blood received in culture bottles   Culture NO GROWTH 3 DAYS  Final   Report Status PENDING  Incomplete  MRSA PCR Screening     Status: None   Collection Time: 01/20/17 10:25 PM  Result Value Ref Range Status   MRSA by PCR NEGATIVE NEGATIVE Final    Comment:        The GeneXpert MRSA Assay (FDA approved for NASAL specimens only), is one component of a comprehensive MRSA colonization surveillance program. It is not intended to diagnose MRSA infection nor to guide or monitor treatment for MRSA infections.   Blood culture (routine x 2)     Status: None (Preliminary result)   Collection Time: 01/20/17 11:24 PM  Result Value Ref Range Status   Specimen Description BLOOD RIGHT HAND  Final   Special Requests   Final    BOTTLES DRAWN AEROBIC AND ANAEROBIC Blood Culture adequate volume   Culture PENDING  Incomplete   Report Status PENDING  Incomplete   Time coordinating discharge: 35 mins  SIGNED:  Standley Dakins, MD  [facility] 01/23/2017, 1:39 PM Pager [phone]  If 7PM-7AM, please contact night-coverage [email or web address] Password [email or web address]

## 2017-01-23 NOTE — Care Management Important Message (Signed)
Important Message  Patient Details  Name: [name] MRN: [id number] Date of Birth: [birth year]   Medicare Important Message Given:  Yes    Robie Mcniel, Chrystine Oiler, RN 01/23/2017, 10:31 AM

## 2017-01-23 NOTE — Care Management Note (Signed)
Case Management Note  Patient Details  Name: [name] MRN: [id number] Date of Birth: 1950/09/26  Subjective/Objective: Adm with GIB, possible pneumonia. From home with family. Family at bedside helping with a bath. Per notes patient [facility] need [facility] RN for PT/INR testing. Discussed with patient. He is agreeable. Offered choice of HH agencies. Will refer to [facility].                   Action/Plan: CM will notify Brad of [facility] who will obtain orders from chart when available.   Expected Discharge Date:      01/23/2017            Expected Discharge Plan:  Home w Home Health Services  In-House Referral:     Discharge planning Services  CM Consult  Post Acute Care Choice:  Home Health Choice offered to:  Patient  DME Arranged:    DME Agency:     HH Arranged:  RN HH Agency:  [facility]  Status of Service:  In process, will continue to follow  If discussed at Long Length of Stay Meetings, dates discussed:    Additional Comments:  Kodee Drury, Chrystine Oiler, RN 01/23/2017, 10:26 AM

## 2017-01-23 NOTE — Discharge Instructions (Signed)
Follow with Primary MD  [place], Julie M, NP  and other consultant's as instructed your Hospitalist MD  Please get a complete blood count and chemistry panel checked by your Primary MD at your next visit, and again as instructed by your Primary MD.  Get Medicines reviewed and adjusted: Please take all your medications with you for your next visit with your Primary MD  Laboratory/radiological data: Please request your Primary MD to go over all hospital tests and procedure/radiological results at the follow up, please ask your Primary MD to get all Hospital records sent to his/her office.  In some cases, they will be blood work, cultures and biopsy results pending at the time of your discharge. Please request that your primary care M.D. follows up on these results.  Also Note the following: If you experience worsening of your admission symptoms, develop shortness of breath, life threatening emergency, suicidal or homicidal thoughts you must seek medical attention immediately by calling 911 or calling your MD immediately  if symptoms less severe.  You must read complete instructions/literature along with all the possible adverse reactions/side effects for all the Medicines you take and that have been prescribed to you. Take any new Medicines after you have completely understood and accpet all the possible adverse reactions/side effects.   Do not drive when taking Pain medications or sleeping medications (Benzodaizepines)  Do not take more than prescribed Pain, Sleep and Anxiety Medications. It is not advisable to combine anxiety,sleep and pain medications without talking with your primary care practitioner  Special Instructions: If you have smoked or chewed Tobacco  in the last 2 yrs please stop smoking, stop any regular Alcohol  and or any Recreational drug use.  Wear Seat belts while driving.  Please note: You were cared for by a hospitalist during your hospital stay. Once you are discharged,  your primary care physician will handle any further medical issues. Please note that NO REFILLS for any discharge medications will be authorized once you are discharged, as it is imperative that you return to your primary care physician (or establish a relationship with a primary care physician if you do not have one) for your post hospital discharge needs so that they can reassess your need for medications and monitor your lab values.

## 2017-01-24 ENCOUNTER — Other Ambulatory Visit: Payer: Self-pay

## 2017-01-24 ENCOUNTER — Telehealth: Payer: Self-pay | Admitting: Gastroenterology

## 2017-01-24 DIAGNOSIS — D649 Anemia, unspecified: Secondary | ICD-10-CM

## 2017-01-24 NOTE — Telephone Encounter (Signed)
Pt is aware and Lab order will be mailed to him to do in 2 weeks.

## 2017-01-24 NOTE — Telephone Encounter (Signed)
Per SF pt is to have CBC in 2 weeks

## 2017-01-24 NOTE — Telephone Encounter (Signed)
OV made and nurse is aware of CBC labs

## 2017-01-25 LAB — CULTURE, BLOOD (ROUTINE X 2)
CULTURE: NO GROWTH
CULTURE: NO GROWTH
Special Requests: ADEQUATE

## 2017-01-25 NOTE — Addendum Note (Signed)
Addended by: Burton Apley A on: 01/25/2017 10:33 AM   Modules accepted: Orders

## 2017-02-01 NOTE — Progress Notes (Signed)
-   Sepsis ruled out 

## 2017-02-07 ENCOUNTER — Encounter (HOSPITAL_COMMUNITY): Payer: Self-pay | Admitting: *Deleted

## 2017-02-07 ENCOUNTER — Emergency Department (HOSPITAL_COMMUNITY)
Admission: EM | Admit: 2017-02-07 | Discharge: 2017-02-07 | Disposition: A | Discharge status: Home / Self Care | Payer: Medicare Other | Attending: Emergency Medicine | Admitting: Emergency Medicine

## 2017-02-07 ENCOUNTER — Other Ambulatory Visit: Payer: Self-pay

## 2017-02-07 ENCOUNTER — Emergency Department (HOSPITAL_COMMUNITY): Payer: Medicare Other

## 2017-02-07 DIAGNOSIS — F17210 Nicotine dependence, cigarettes, uncomplicated: Secondary | ICD-10-CM | POA: Diagnosis not present

## 2017-02-07 DIAGNOSIS — R531 Weakness: Secondary | ICD-10-CM | POA: Diagnosis present

## 2017-02-07 DIAGNOSIS — I2510 Atherosclerotic heart disease of native coronary artery without angina pectoris: Secondary | ICD-10-CM | POA: Insufficient documentation

## 2017-02-07 DIAGNOSIS — Z79899 Other long term (current) drug therapy: Secondary | ICD-10-CM | POA: Insufficient documentation

## 2017-02-07 LAB — URINALYSIS, ROUTINE W REFLEX MICROSCOPIC
BILIRUBIN URINE: NEGATIVE
Glucose, UA: NEGATIVE mg/dL
Hgb urine dipstick: NEGATIVE
KETONES UR: 20 mg/dL — AB
Leukocytes, UA: NEGATIVE
NITRITE: NEGATIVE
PH: 5 (ref 5.0–8.0)
PROTEIN: NEGATIVE mg/dL
Specific Gravity, Urine: 1.025 (ref 1.005–1.030)

## 2017-02-07 LAB — CBC
HEMATOCRIT: 30.8 % — AB (ref 39.0–52.0)
Hemoglobin: 9.5 g/dL — ABNORMAL LOW (ref 13.0–17.0)
MCH: 28.7 pg (ref 26.0–34.0)
MCHC: 30.8 g/dL (ref 30.0–36.0)
MCV: 93.1 fL (ref 78.0–100.0)
PLATELETS: 180 10*3/uL (ref 150–400)
RBC: 3.31 MIL/uL — ABNORMAL LOW (ref 4.22–5.81)
RDW: 20.2 % — AB (ref 11.5–15.5)
WBC: 3.4 10*3/uL — ABNORMAL LOW (ref 4.0–10.5)

## 2017-02-07 LAB — BASIC METABOLIC PANEL
Anion gap: 11 (ref 5–15)
BUN: 10 mg/dL (ref 6–20)
CALCIUM: 9.5 mg/dL (ref 8.9–10.3)
CO2: 23 mmol/L (ref 22–32)
CREATININE: 0.78 mg/dL (ref 0.61–1.24)
Chloride: 103 mmol/L (ref 101–111)
GFR calc Af Amer: >60 mL/min (ref >60)
GLUCOSE: 99 mg/dL (ref 65–99)
Potassium: 4.1 mmol/L (ref 3.5–5.1)
SODIUM: 137 mmol/L (ref 135–145)

## 2017-02-07 LAB — PROTIME-INR
INR: 2.37
Prothrombin Time: 25.7 seconds — ABNORMAL HIGH (ref 11.4–15.2)

## 2017-02-07 MED ORDER — HYDROCODONE-ACETAMINOPHEN 5-325 MG PO TABS: 1.0000 | ORAL_TABLET | Freq: Four times a day (QID) | ORAL | 0 refills | Status: AC | PRN

## 2017-02-07 MED ORDER — HYDROCODONE-ACETAMINOPHEN 5-325 MG PO TABS
1.0000 | ORAL_TABLET | Freq: Once | ORAL | Status: AC
  Administered 2017-02-07: 1 via ORAL
  Filled 2017-02-07: qty 1

## 2017-02-07 MED ORDER — SODIUM CHLORIDE 0.9 % IV BOLUS (SEPSIS)
1000.0000 mL | Freq: Once | INTRAVENOUS | Status: AC
  Administered 2017-02-07: 1000 mL via INTRAVENOUS

## 2017-02-07 NOTE — Discharge Instructions (Signed)
Please follow-up closely with your primary care doctor for re-check. Your blood work shows improving red blood cell count. Your pneumonia has improved. The rest of your blood work and urine are reassuring.  Get rest, drink plenty of fluids. Return for worsening symptoms, including fever, confusion, or any other symptoms concerning to you.

## 2017-02-07 NOTE — ED Provider Notes (Signed)
[facility] EMERGENCY DEPARTMENT Provider Note   CSN: [id number] Arrival date & time: 02/07/17  1406     History   Chief Complaint Chief Complaint  Patient presents with  . Weakness    HPI [name] is a 66 y.o. male.  HPI 66 year old male who presents with generalized weakness. 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 He was recently admitted to the hospital at the end of September through October 1 for acute blood loss anemia related to GI bleed and supratherapeutic INR. He had erosive gastritis on his upper endoscopy. States that since discharge from the hospital more than 2 weeks ago he has had generalized weakness, soreness all over, low back pain. He was treated for pneumonia in the hospital, but continues to have cough productive of clear sputum. Denies any fever, but last night he did have night sweats. Denies nausea, vomiting, diarrhea, melena or hematochezia, dysuria or urinary frequency, chest pain, syncope or near syncope, or frequent falls.   Past Medical History:  Diagnosis Date  . Blood clot in vein 2011   clot in right leg ,broke off into right lung  . Hypertension    pt. denies htn,was treated 10 yrs, ago,not on any meds now  . Pancreatitis, alcoholic     Patient Active Problem List   Diagnosis Date Noted  . Symptomatic anemia   . Elevated INR   . Melena   . Transient hypotension   . Sepsis (HCC) 01/20/2017  . CAP (community acquired pneumonia) 01/20/2017  . Supratherapeutic INR 01/20/2017  . Upper GI bleeding 01/20/2017  . Acute blood loss anemia 01/20/2017  . Pressure ulcer 05/14/2015  . 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 Colon polyp 04/30/2015  . PAD (peripheral artery disease) (HCC) 04/30/2015  . DVT of leg (deep venous thrombosis) (HCC) 02/18/2011    Past Surgical History:  Procedure Laterality Date  . AORTA - BILATERAL FEMORAL ARTERY  BYPASS GRAFT Bilateral 05/13/2015   Procedure: AORTO BIFEMORAL BYPASS GRAFT USING 14 X8 MM X 40 CM HEMASHIELD GRAFT .BILATERAL FEMORAL ARTEERY ENDARTERECTOMY & BILATERAL PROFUNDAPLASTY;  Surgeon: Sherren Kerns, MD;  Location: [facility] OR;  Service: Vascular;  Laterality: Bilateral;  . BIOPSY  01/21/2017   Procedure: BIOPSY;  Surgeon: West Bali, MD;  Location: [facility] ENDO SUITE;  Service: Endoscopy;;  gastric  . ESOPHAGOGASTRODUODENOSCOPY N/A 01/21/2017   Procedure: ESOPHAGOGASTRODUODENOSCOPY (EGD);  Surgeon: West Bali, MD;  Location: [facility] ENDO SUITE;  Service: Endoscopy;  Laterality: N/A;  . HIP SURGERY     right       Home Medications    Prior to Admission medications   Medication Sig Start Date End Date Taking? Authorizing Provider  albuterol (PROVENTIL HFA) 108 (90 Base) MCG/ACT inhaler Inhale 2 puffs into the lungs every 6 (six) hours as needed for wheezing or shortness of breath.   Yes [provider]  gabapentin (NEURONTIN) 300 MG capsule Take 300 mg by mouth at bedtime. 12/30/16  Yes [provider]  pantoprazole (PROTONIX) 40 MG tablet Take 1 tablet (40 mg total) by mouth 2 (two) times daily before a meal. 01/23/17 04/23/17 Yes Johnson, Clanford L, MD  tamsulosin (FLOMAX) 0.4 MG CAPS capsule Take 0.4 mg by mouth daily after supper.  07/21/15  Yes [provider]  traMADol (ULTRAM) 50 MG tablet Take 100 mg by mouth 2 (two) times daily. Pain   Yes [provider]  warfarin (COUMADIN) 4  MG tablet Take 1 tablet (4 mg total) by mouth daily. 01/23/17  Yes Johnson, Clanford L, MD  HYDROcodone-acetaminophen (NORCO/VICODIN) 5-325 MG tablet Take 1 tablet by mouth every 6 (six) hours as needed for severe pain. 02/07/17   Lavera Guise, MD    Family History Family History  Problem Relation Age of Onset  . Heart disease Sister   . Heart disease Brother     Social History Social History  Substance Use Topics  . Smoking status: Current Some Day Smoker     Packs/day: 0.25    Years: 40.00    Types: Cigarettes    Start date: 09/26/1964    Last attempt to quit: 05/12/2015  . Smokeless tobacco: Never Used  . Alcohol use No     Allergies   Sulfa antibiotics   Review of Systems Review of Systems  Constitutional: Positive for appetite change and fatigue.  Respiratory: Positive for cough.   Cardiovascular: Negative for chest pain.  Gastrointestinal: Negative for abdominal pain, blood in stool, diarrhea, nausea and vomiting.  Genitourinary: Negative for dysuria.  Hematological: Bruises/bleeds easily.  Psychiatric/Behavioral: Negative for confusion.  All other systems reviewed and are negative.    Physical Exam Updated Vital Signs BP 126/73   Pulse 70   Temp 98.4 F (36.9 C) (Oral)   Resp 12   Ht  (1.676 m)   Wt 61.2 kg (135 lb)   SpO2 100%   BMI 21.79 kg/m   Physical Exam Physical Exam  Nursing note and vitals reviewed. Constitutional: Thin, chronically ill apeparing, non-toxic, and in no acute distress Head: Normocephalic and atraumatic.  Mouth/Throat: Oropharynx is clear and dry.  Neck: Normal range of motion. Neck supple.  Cardiovascular: Normal rate and regular rhythm.   Pulmonary/Chest: Effort normal and breath sounds normal.  Abdominal: Soft. There is no tenderness. There is no rebound and no guarding.  Musculoskeletal: Normal range of motion.  Neurological: Alert, no facial droop, fluent speech, moves all extremities symmetrically Skin: Skin is warm and dry.  Psychiatric: Cooperative   ED Treatments / Results  Labs (all labs ordered are listed, but only abnormal results are displayed) Labs Reviewed  CBC - Abnormal; Notable for the following:       Result Value   WBC 3.4 (*)    RBC 3.31 (*)    Hemoglobin 9.5 (*)    HCT 30.8 (*)    RDW 20.2 (*)    All other components within normal limits  URINALYSIS, ROUTINE W REFLEX MICROSCOPIC - Abnormal; Notable for the following:    Color, Urine AMBER (*)     APPearance HAZY (*)    Ketones, ur 20 (*)    All other components within normal limits  PROTIME-INR - Abnormal; Notable for the following:    Prothrombin Time 25.7 (*)    All other components within normal limits  BASIC METABOLIC PANEL    EKG  EKG Interpretation None       Radiology Dg Chest 2 View  Result Date: 02/07/2017 CLINICAL DATA:  Cough with clear phlegm since discharge from the hospital 1 week ago, history hypertension, occasional smoker, alcoholic pancreatitis EXAM: CHEST  2 VIEW COMPARISON:  01/20/2017 FINDINGS: Normal heart size, mediastinal contours, and pulmonary vascularity. Mild atherosclerotic calcification aorta. Suspected RIGHT nipple shadow, new since the 01/20/2017. Streaky atelectasis at medial RIGHT upper lobe. Lungs hyperinflated otherwise clear. No definite infiltrate, pleural effusion or pneumothorax. IMPRESSION: Streaky atelectasis at medial RIGHT upper lobe with question underlying emphysematous changes. Suspected  RIGHT nipple shadow. No acute infiltrate. Electronically Signed   By: Ulyses Southward M.D.   On: 02/07/2017 18:00    Procedures Procedures (including critical care time)  Medications Ordered in ED Medications  sodium chloride 0.9 % bolus 1,000 mL (1,000 mLs Intravenous New Bag/Given 02/07/17 1746)  HYDROcodone-acetaminophen (NORCO/VICODIN) 5-325 MG per tablet 1 tablet (1 tablet Oral Given 02/07/17 1821)     Initial Impression / Assessment and Plan / ED Course  I have reviewed the triage vital signs and the nursing notes.  Pertinent labs & imaging results that were available during my care of the patient were reviewed by me and considered in my medical decision making (see chart for details).     Presents with generalized weakness since recent hospitalization for upper GI bleeding. I he is nontoxic in no acute distress. Afebrile with normal hemodynamics. No focal neurological deficits. No evidence of recurrent GI bleeding. He has an improving  hemoglobin since discharge, today 9.5. No evidence of UTI. He has a resolving pneumonia on chest x-ray. Given IV fluids, and he feels improved. I feel he is stable for discharge with close follow-up with PCP. Strict return and follow-up instructions reviewed. He expressed understanding of all discharge instructions and felt comfortable with the plan of care.   Final Clinical Impressions(s) / ED Diagnoses   Final diagnoses:  Generalized weakness    New Prescriptions New Prescriptions   HYDROCODONE-ACETAMINOPHEN (NORCO/VICODIN) 5-325 MG TABLET    Take 1 tablet by mouth every 6 (six) hours as needed for severe pain.     Lavera Guise, MD 02/07/17 2012

## 2017-02-07 NOTE — ED Triage Notes (Addendum)
Pt reports he had a blood transfusion 01/20/17 and "hasn't felt right since". Pt reports generalized body aches and generalized weakness. Pt also c/o swelling to left chest. Denies injury to chest. Wife reports pt feels really warm at nighttime.

## 2017-02-10 ENCOUNTER — Telehealth: Payer: Self-pay | Admitting: Gastroenterology

## 2017-02-10 ENCOUNTER — Encounter: Payer: Self-pay | Admitting: Gastroenterology

## 2017-02-10 MED ORDER — AMOXICILLIN 500 MG PO TABS: ORAL_TABLET | ORAL | 0 refills | Status: AC

## 2017-02-10 MED ORDER — PANTOPRAZOLE SODIUM 40 MG PO TBEC: 40.0000 mg | DELAYED_RELEASE_TABLET | Freq: Two times a day (BID) | ORAL | 3 refills | Status: AC

## 2017-02-10 MED ORDER — CLARITHROMYCIN 500 MG PO TABS: ORAL_TABLET | ORAL | 0 refills | Status: AC

## 2017-02-10 NOTE — Telephone Encounter (Signed)
Called patient TO DISCUSS RESULTS. STEP-DAUGHTER ASKED ME TO LEAVE INFO ON ANSWERING. LEFT VOICEMAIL-BOPSIES showed H. Pylori infection. He needs AMOXICILLIN 500 mg 2 po BID for 10 days and Biaxin 500 mg po bid for 10 days. CONTINUE PROTONIX BID. REDUCE COUMADIN TO 2 MG DAILY WHILE TAKING ABX. Med side effects include NVD, abd pain, and metallic taste.   NEEDS PT/INR CHECKED ON MON OCT 22. FOLLOW UP IN 2 MOS E30 UGIB/H PYLORI GASTRITIS

## 2017-02-13 ENCOUNTER — Telehealth: Payer: Self-pay | Admitting: Gastroenterology

## 2017-02-13 NOTE — Telephone Encounter (Signed)
See other phone note

## 2017-02-13 NOTE — Telephone Encounter (Signed)
NEEDS PT/INR CHECKED ON MON OCT 22

## 2017-02-13 NOTE — Telephone Encounter (Signed)
Reminder in epic °

## 2017-02-14 ENCOUNTER — Telehealth: Payer: Self-pay | Admitting: Gastroenterology

## 2017-02-14 NOTE — Telephone Encounter (Signed)
Tried to call pt, NA- LMOM-asked him to call back and let me know if he had his pt/inr done with his MD that keeps up with his coumadin or if we needed to put that order in. Asked him to call back and let me know.

## 2017-02-14 NOTE — Telephone Encounter (Signed)
I spoke with the pt, he said he thinks he has been taking coumadin 4mg  daily, he did not reduce it. He is aware to reduce it but he stated he has finished his abx. I advised him that it was just sent in on 02/10/17 and he should have had enough for 10 days worth which would mean he should still be on it and we need to check his INR. He said the nurse came out today but she didn't check it while she was there. Pt stated his wife takes care of his meds and he will ask her when she gets home and will call back and let me know what he is taking.

## 2017-02-14 NOTE — Telephone Encounter (Signed)
[phone] please call patient he returned call

## 2017-02-14 NOTE — Telephone Encounter (Signed)
See other phone note

## 2017-02-14 NOTE — Telephone Encounter (Signed)
pts wife called back, she said she hasnt picked up any abx from the pharmacy and the pt hasnt taken anything. He is currently taking coumadin 4mg  daily. She [facility] [phone]. I have called the pharmacy and left the rx's on the voicemail per what slf had sent in. I called pts pcp- Julie SwazilandJordan NP at [phone] and spoke with Marchelle FolksAmanda, they will let NP know that we have started abx and the change in coumadin. Marchelle Folksmanda said they would be happy to check the pt/inr but the pt will need to call and schedule that. pts wife said she will call.

## 2017-03-28 ENCOUNTER — Encounter: Payer: Self-pay | Admitting: Gastroenterology

## 2017-03-28 ENCOUNTER — Telehealth: Payer: Self-pay | Admitting: Gastroenterology

## 2017-03-28 ENCOUNTER — Ambulatory Visit: Payer: Medicare Other | Admitting: Gastroenterology

## 2017-03-28 NOTE — Telephone Encounter (Signed)
PATIENT WAS A NO SHOW AND LETTER SENT  °

## 2017-03-30 NOTE — Telephone Encounter (Signed)
Called patient to reschedule and was told he passed away 08-18-2016

## 2017-03-30 NOTE — Telephone Encounter (Signed)
Patient needs to reschedule OV.

## 2017-04-11 ENCOUNTER — Telehealth: Payer: Self-pay | Admitting: Gastroenterology

## 2017-04-11 NOTE — Telephone Encounter (Signed)
A family member called to let SF know that the patient past away on December 5th

## 2017-04-11 NOTE — Telephone Encounter (Signed)
So sorry! 

## 2017-04-11 NOTE — Telephone Encounter (Addendum)
PLEASE SEND A CARD EXPRESSING OUR DEEPEST CONDOLENCES.

## 2017-04-12 NOTE — Telephone Encounter (Signed)
I mailed his family a sympathy card

## 2017-04-25 DEATH — deceased

## 2017-06-06 ENCOUNTER — Encounter: Payer: Self-pay | Admitting: Cardiology

## 2018-01-16 IMAGING — CR DG CHEST 1V PORT
1 series · 1 of 1 positions shown · non-contrast
Comparison: 02/22/2011, 10/11/2009

CLINICAL DATA: Status post aortobifemoral bypass graft placement.

EXAM:
PORTABLE CHEST 1 VIEW

[AP]
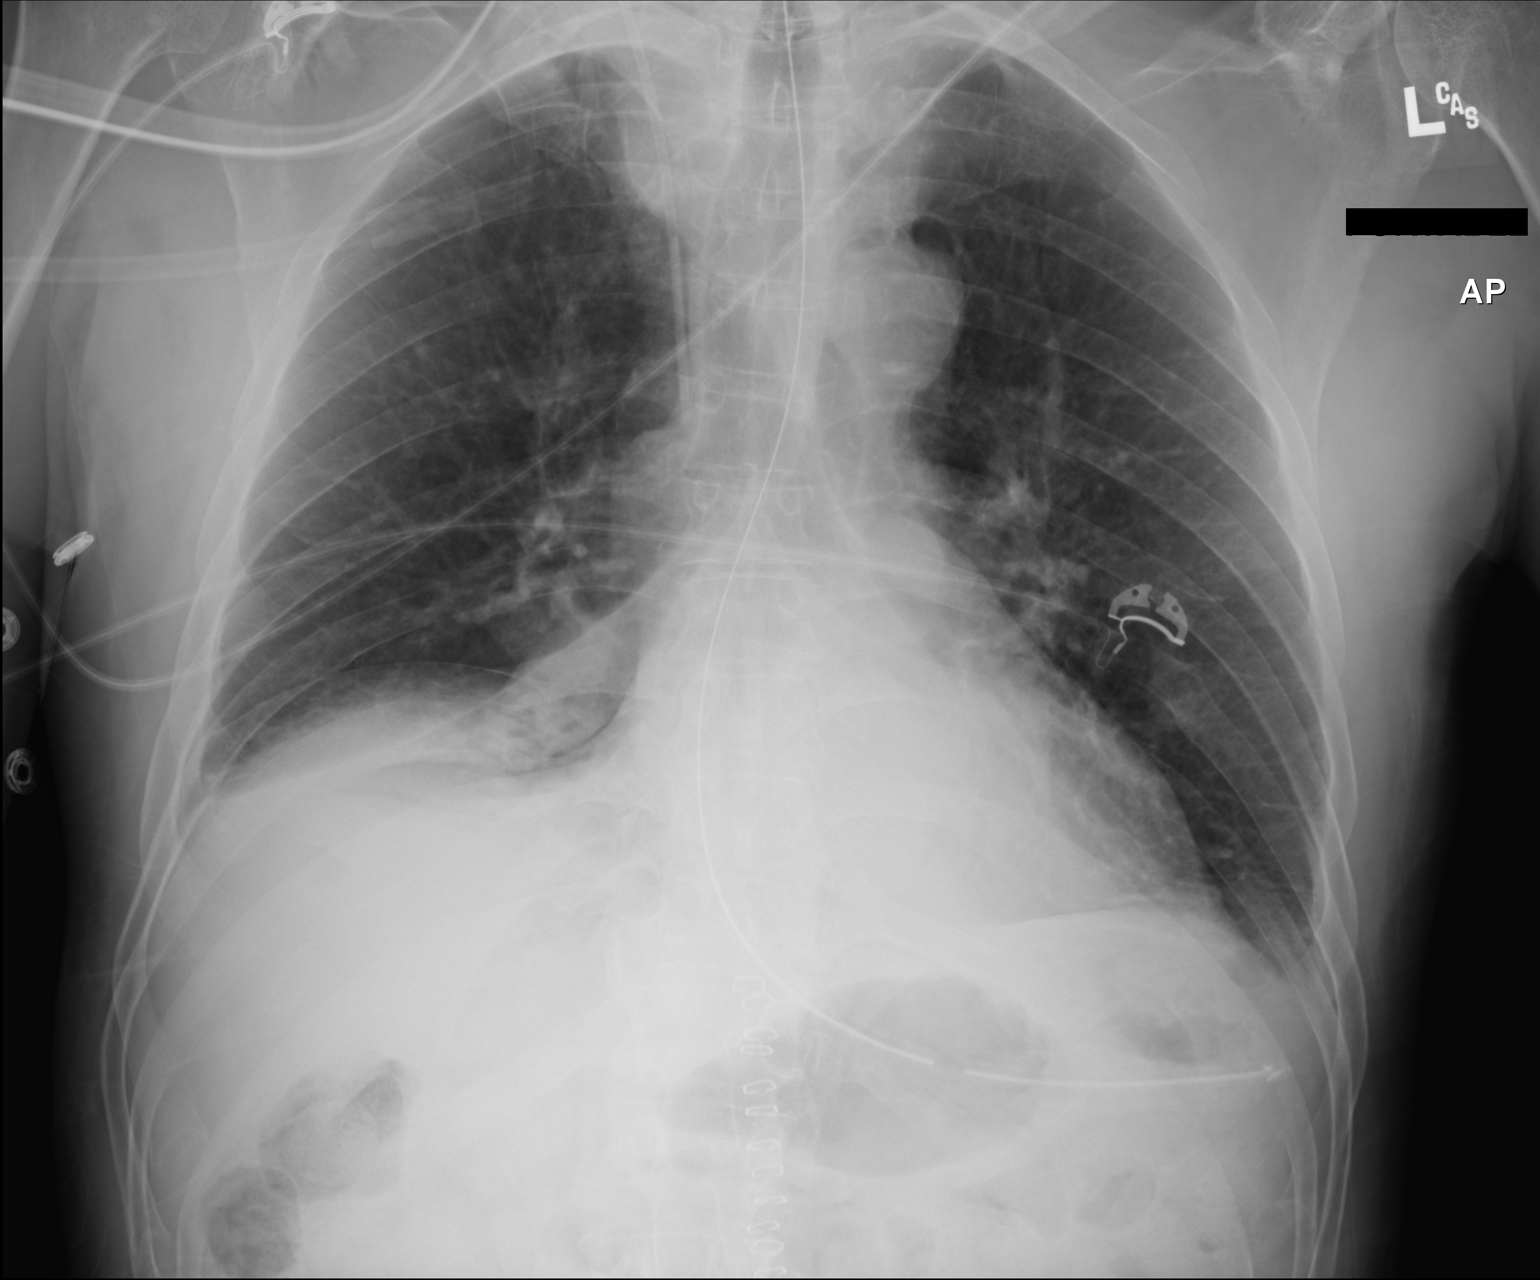

[1 of 1 positions shown; findings below may reference images not displayed]

FINDINGS: Decompression tube extends into the stomach. Right jugular central
line present with the tip in the SVC. There is significant
atelectasis involving the right lower lobe. No edema, pneumothorax
or significant pleural fluid is seen. Mild atelectasis present at
the left lung base. The heart size is normal.
IMPRESSION: Significant atelectasis of the right lower lobe. Mild left basilar
atelectasis.

## 2018-01-16 IMAGING — CR DG ABD PORTABLE 1V
1 series · 1 of 1 positions shown · non-contrast
Comparison: CTA round off 04/29/2015. Lumbar radiographs
02/18/2011.

CLINICAL DATA: 64-year-old male status post aortobifemoral bypass
graft. Initial encounter.

EXAM:
PORTABLE ABDOMEN - 1 VIEW

[AP]
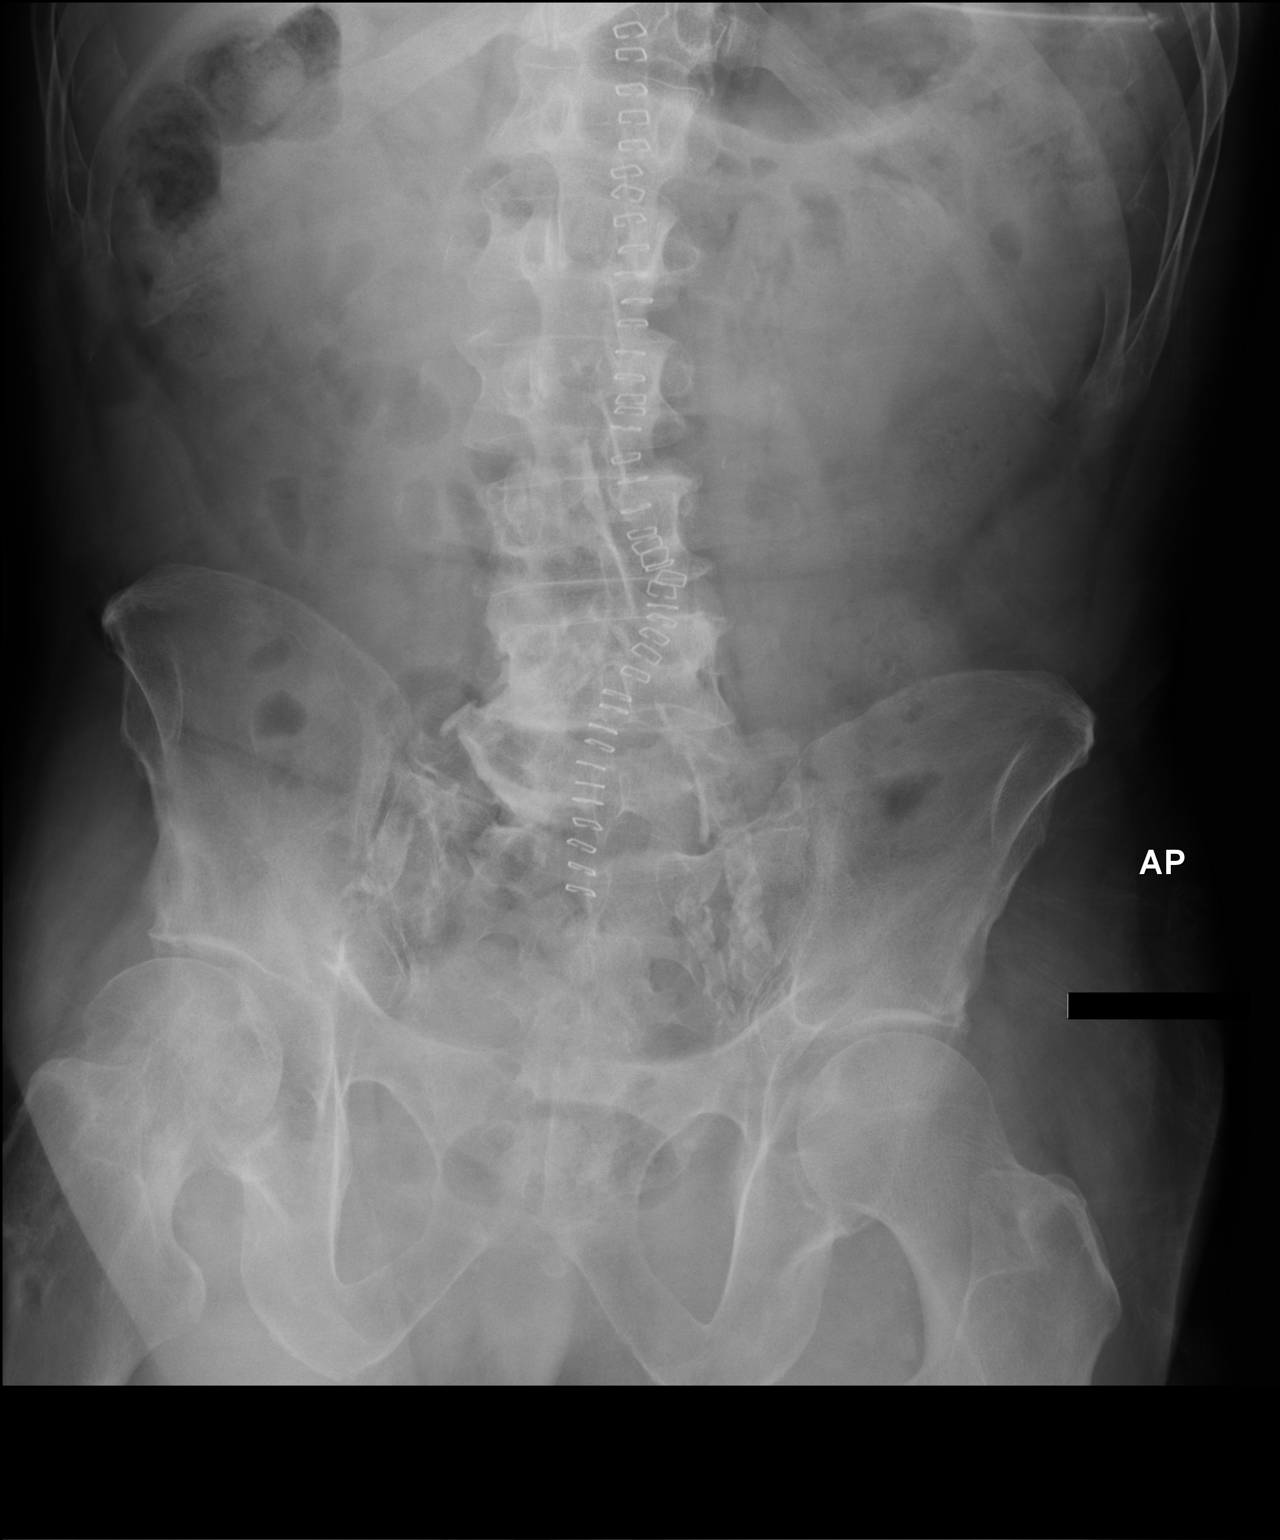

[1 of 1 positions shown; findings below may reference images not displayed]

FINDINGS: Portable AP view at 2219 hours. Extensive aortic and severe iliac
artery calcified atherosclerosis. Midline abdominal skin staples. No
radiopaque foreign body identified. Negative visualized bowel gas
pattern. Abdominal and pelvic visceral contours are within normal
limits. No acute osseous abnormality identified.
IMPRESSION: Midline skin staples but otherwise no radiopaque foreign body
identified. Severe aortoiliac calcified atherosclerosis.

## 2018-01-17 IMAGING — CR DG CHEST 1V PORT
1 series · 1 of 1 positions shown · non-contrast
Comparison: May 13, 2015

CLINICAL DATA: Status post aortobifemoral bypass grafting.
Hypertension.

EXAM:
PORTABLE CHEST 1 VIEW

[AP]
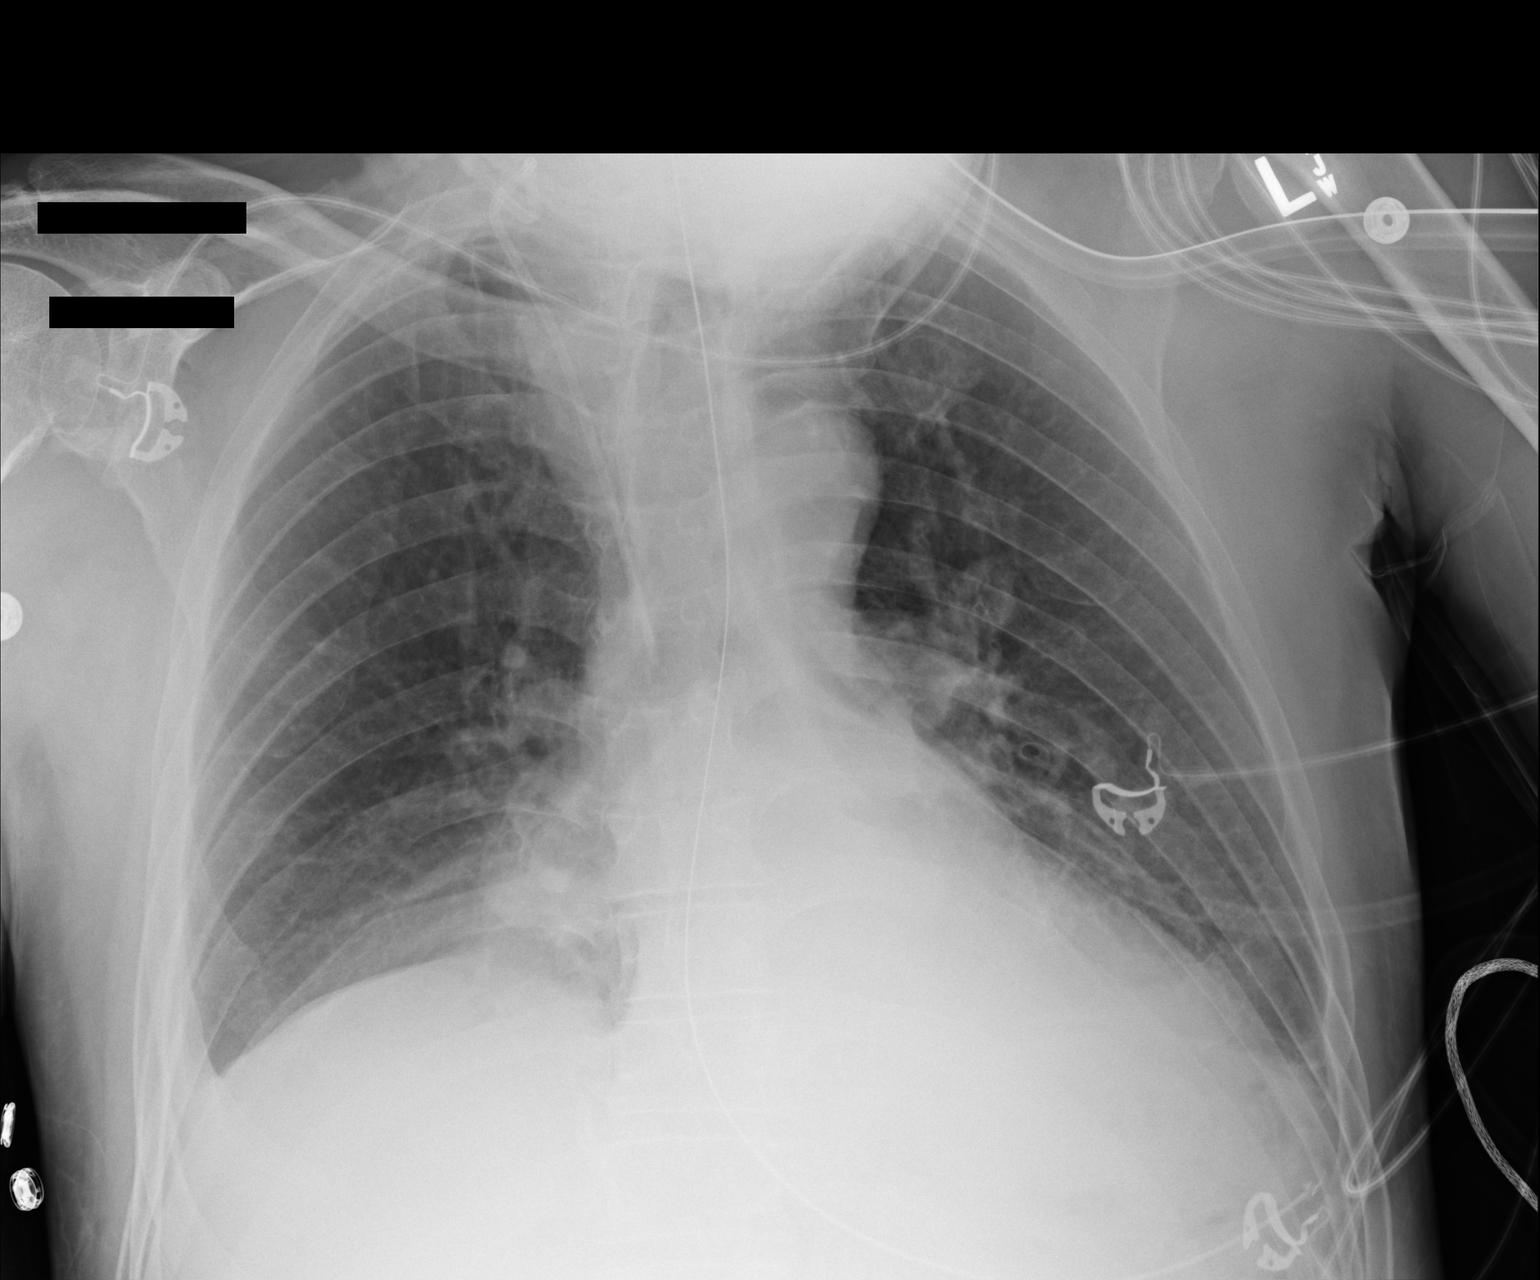

[1 of 1 positions shown; findings below may reference images not displayed]

FINDINGS: Central catheter tip is in the superior vena cava. Nasogastric tube
tip and side port are in the stomach. No pneumothorax. There is
consolidation in the left base. There is mild right base
atelectasis. The lungs elsewhere are clear. Heart is upper normal in
size with pulmonary vascularity within normal limits. No adenopathy.
IMPRESSION: Tube and catheter positions unchanged without pneumothorax.
Persistent consolidation left base. Improved aeration right base
with mild residual right base atelectasis. No change in cardiac
silhouette.

## 2019-10-14 IMAGING — DX DG CHEST 2V
2 series · 2 of 2 positions shown · non-contrast
Comparison: 01/20/2017

CLINICAL DATA: Cough with clear phlegm since discharge from the
hospital 1 week ago, history hypertension, occasional smoker,
alcoholic pancreatitis

EXAM:
CHEST  2 VIEW

[chest lat]
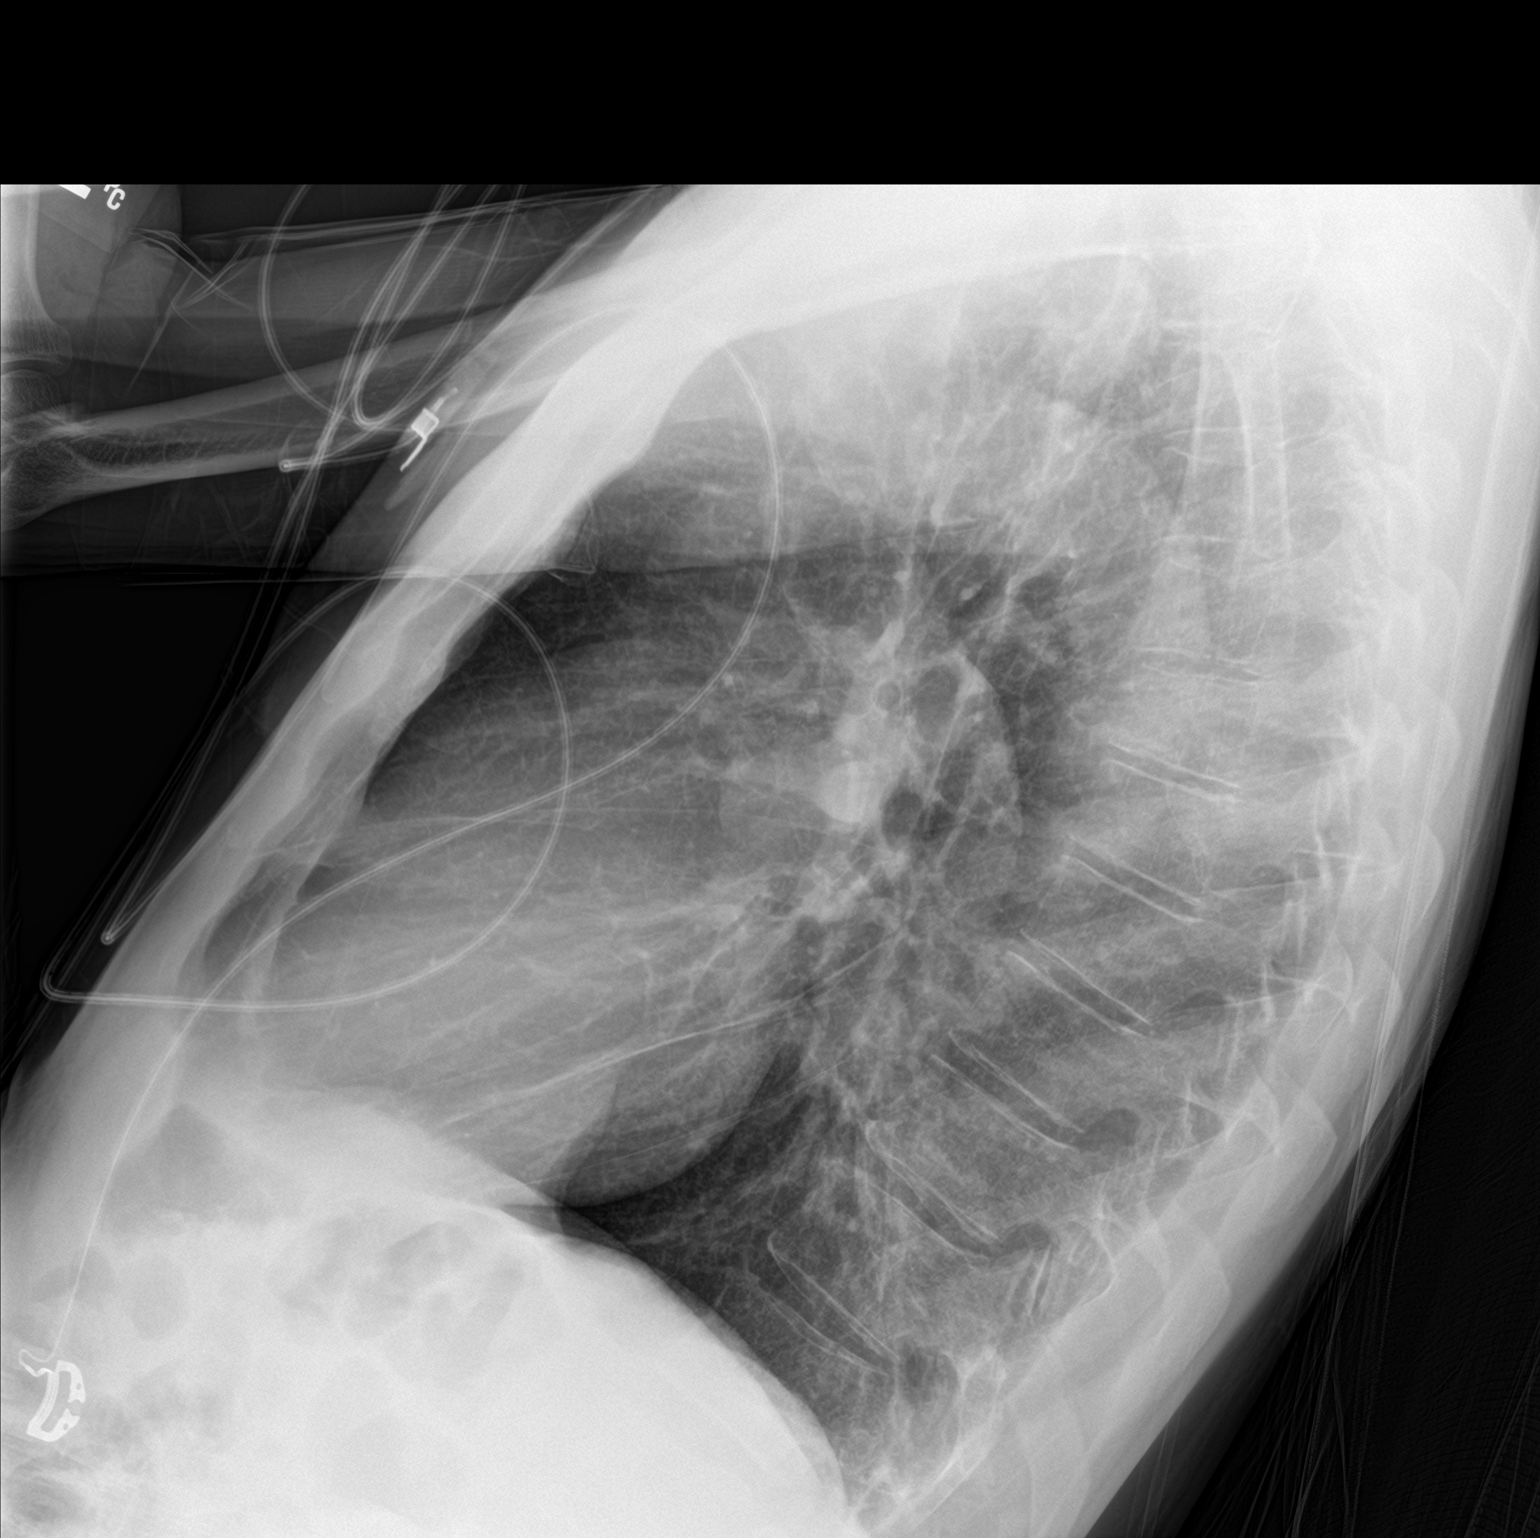

[chest ap]
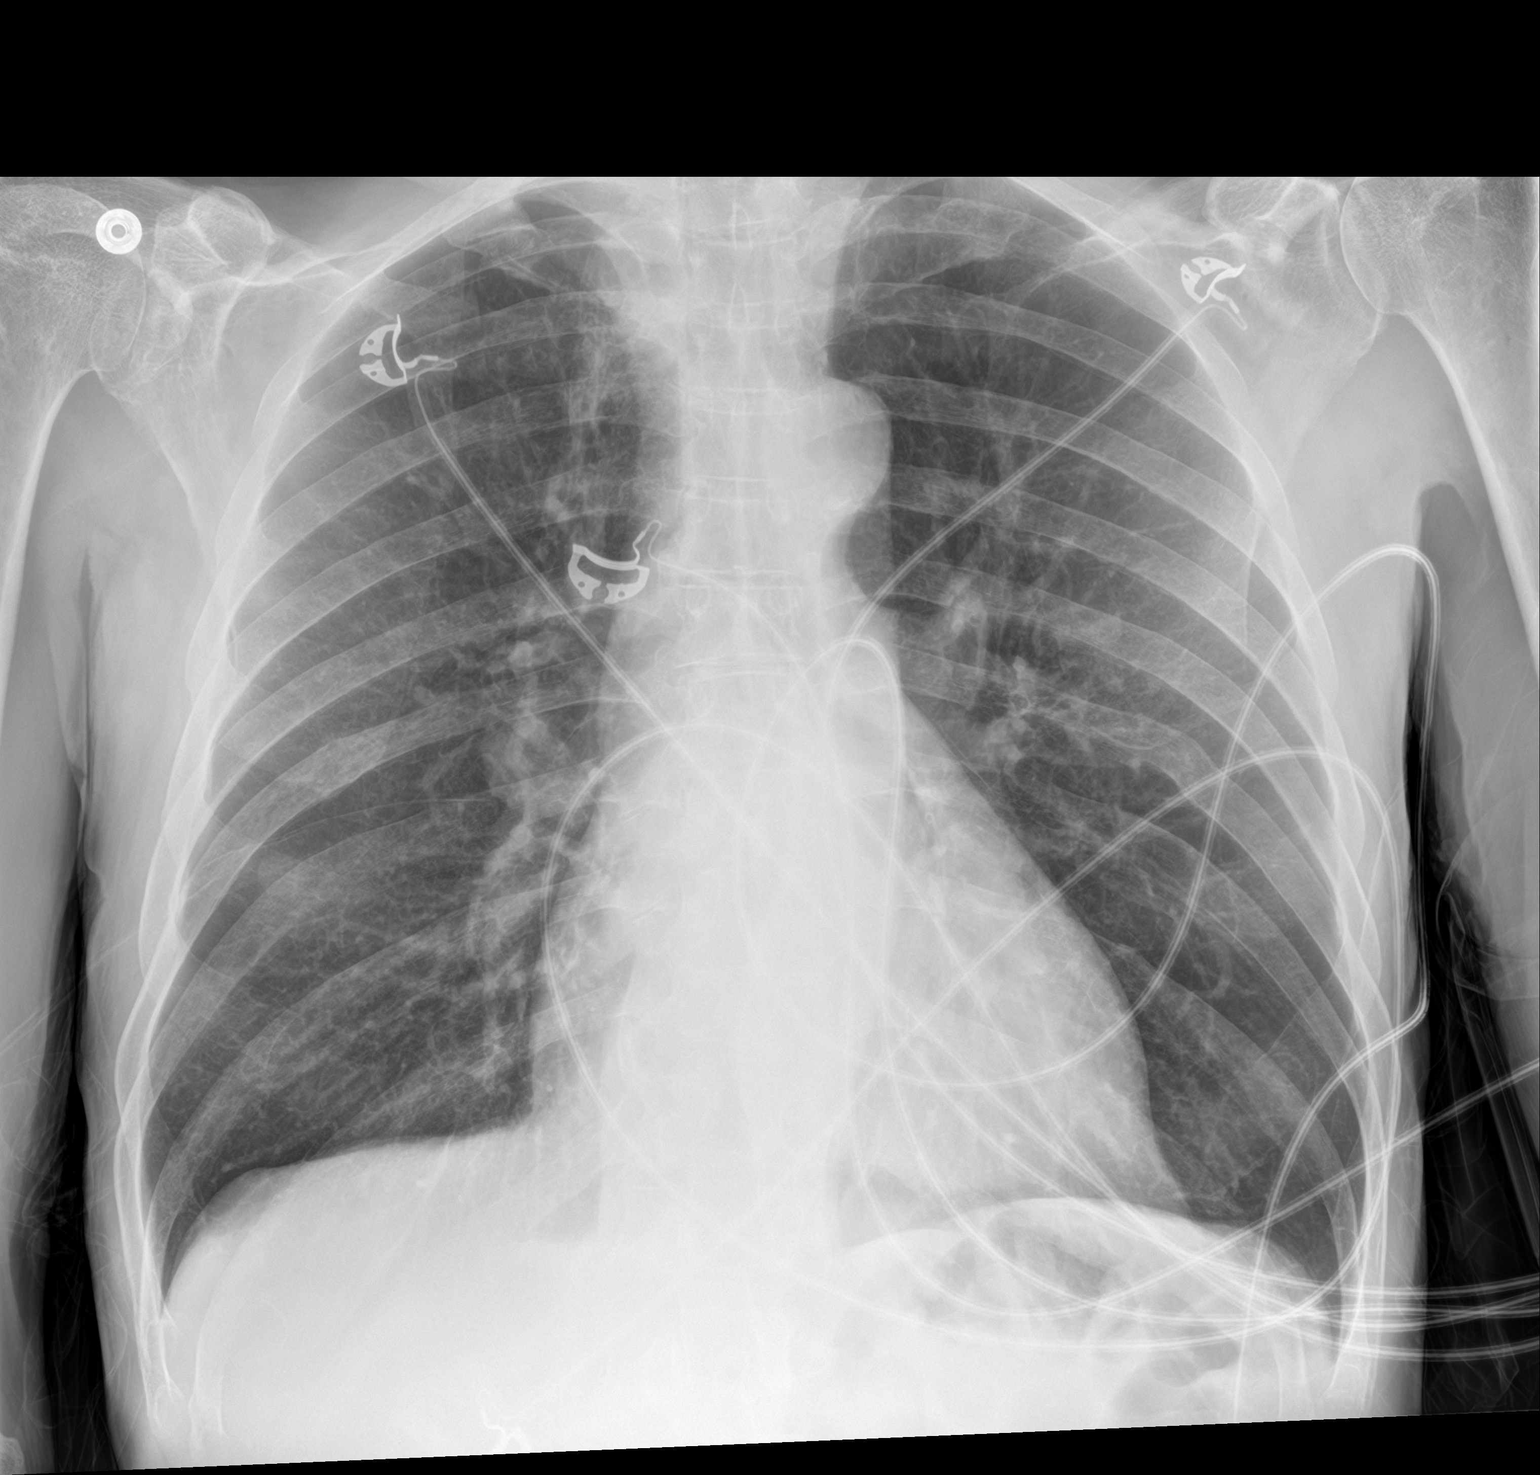

[2 of 2 positions shown; findings below may reference images not displayed]

FINDINGS: Normal heart size, mediastinal contours, and pulmonary vascularity.

Mild atherosclerotic calcification aorta.

Suspected RIGHT nipple shadow, new since the 01/20/2017.

Streaky atelectasis at medial RIGHT upper lobe.

Lungs hyperinflated otherwise clear.

No definite infiltrate, pleural effusion or pneumothorax.
IMPRESSION: Streaky atelectasis at medial RIGHT upper lobe with question
underlying emphysematous changes.

Suspected RIGHT nipple shadow.

No acute infiltrate.
